# Patient Record
Sex: Female | Born: 1960 | Race: White | Hispanic: No | Smoking: Never smoker
Health system: Southern US, Community
[De-identification: ages and names within clinical notes are randomized; demographics above are authoritative.]

## PROBLEM LIST (undated history)

## (undated) DIAGNOSIS — C801 Malignant (primary) neoplasm, unspecified: Secondary | ICD-10-CM

## (undated) DIAGNOSIS — K219 Gastro-esophageal reflux disease without esophagitis: Secondary | ICD-10-CM

## (undated) DIAGNOSIS — I10 Essential (primary) hypertension: Secondary | ICD-10-CM

## (undated) HISTORY — PX: FRACTURE SURGERY: SHX138

## (undated) HISTORY — PX: CHOLECYSTECTOMY: SHX55

---

## 2004-05-27 ENCOUNTER — Ambulatory Visit: Payer: Self-pay

## 2005-09-02 ENCOUNTER — Ambulatory Visit: Payer: Self-pay

## 2006-09-28 ENCOUNTER — Ambulatory Visit: Payer: Self-pay

## 2007-10-19 ENCOUNTER — Ambulatory Visit: Payer: Self-pay

## 2007-12-28 ENCOUNTER — Ambulatory Visit: Payer: Self-pay

## 2008-04-30 ENCOUNTER — Encounter: Admission: RE | Admit: 2008-04-30 | Discharge: 2008-04-30 | Payer: Self-pay | Admitting: Neurosurgery

## 2008-10-03 ENCOUNTER — Ambulatory Visit: Payer: Self-pay

## 2009-02-22 ENCOUNTER — Emergency Department: Payer: Self-pay | Admitting: Emergency Medicine

## 2009-02-27 ENCOUNTER — Ambulatory Visit: Payer: Self-pay | Admitting: Specialist

## 2009-10-15 ENCOUNTER — Ambulatory Visit: Payer: Self-pay

## 2011-01-02 ENCOUNTER — Ambulatory Visit: Payer: Self-pay

## 2012-01-18 ENCOUNTER — Ambulatory Visit: Payer: Self-pay

## 2012-02-11 ENCOUNTER — Ambulatory Visit: Payer: Self-pay | Admitting: Internal Medicine

## 2013-01-18 ENCOUNTER — Ambulatory Visit: Payer: Self-pay

## 2013-04-26 ENCOUNTER — Ambulatory Visit: Payer: Self-pay | Admitting: Specialist

## 2013-12-04 DIAGNOSIS — E7800 Pure hypercholesterolemia, unspecified: Secondary | ICD-10-CM | POA: Insufficient documentation

## 2013-12-04 DIAGNOSIS — Z6835 Body mass index (BMI) 35.0-35.9, adult: Secondary | ICD-10-CM

## 2013-12-04 DIAGNOSIS — I10 Essential (primary) hypertension: Secondary | ICD-10-CM | POA: Insufficient documentation

## 2013-12-04 HISTORY — DX: Pure hypercholesterolemia, unspecified: E78.00

## 2014-02-01 ENCOUNTER — Ambulatory Visit: Payer: Self-pay

## 2015-01-11 ENCOUNTER — Other Ambulatory Visit: Payer: Self-pay | Admitting: Obstetrics and Gynecology

## 2015-01-11 DIAGNOSIS — Z1231 Encounter for screening mammogram for malignant neoplasm of breast: Secondary | ICD-10-CM

## 2015-02-04 ENCOUNTER — Ambulatory Visit: Payer: Self-pay

## 2015-02-05 ENCOUNTER — Ambulatory Visit
Admission: RE | Admit: 2015-02-05 | Discharge: 2015-02-05 | Disposition: A | Discharge status: Home / Self Care | Payer: BLUE CROSS/BLUE SHIELD | Source: Ambulatory Visit | Attending: Obstetrics and Gynecology | Admitting: Obstetrics and Gynecology

## 2015-02-05 DIAGNOSIS — Z1231 Encounter for screening mammogram for malignant neoplasm of breast: Secondary | ICD-10-CM | POA: Diagnosis not present

## 2015-04-24 DIAGNOSIS — Z0000 Encounter for general adult medical examination without abnormal findings: Secondary | ICD-10-CM | POA: Insufficient documentation

## 2016-01-23 ENCOUNTER — Other Ambulatory Visit: Payer: Self-pay | Admitting: Obstetrics and Gynecology

## 2016-01-23 DIAGNOSIS — Z1231 Encounter for screening mammogram for malignant neoplasm of breast: Secondary | ICD-10-CM

## 2016-02-07 ENCOUNTER — Ambulatory Visit: Payer: BLUE CROSS/BLUE SHIELD

## 2016-03-04 ENCOUNTER — Other Ambulatory Visit: Payer: Self-pay | Admitting: Obstetrics and Gynecology

## 2016-03-04 ENCOUNTER — Ambulatory Visit
Admission: RE | Admit: 2016-03-04 | Discharge: 2016-03-04 | Disposition: A | Discharge status: Home / Self Care | Payer: BLUE CROSS/BLUE SHIELD | Source: Ambulatory Visit | Attending: Obstetrics and Gynecology | Admitting: Obstetrics and Gynecology

## 2016-03-04 DIAGNOSIS — Z1231 Encounter for screening mammogram for malignant neoplasm of breast: Secondary | ICD-10-CM | POA: Insufficient documentation

## 2016-03-04 HISTORY — DX: Malignant (primary) neoplasm, unspecified: C80.1

## 2017-02-03 ENCOUNTER — Other Ambulatory Visit: Payer: Self-pay | Admitting: Obstetrics and Gynecology

## 2017-02-03 DIAGNOSIS — Z1239 Encounter for other screening for malignant neoplasm of breast: Secondary | ICD-10-CM

## 2017-03-09 ENCOUNTER — Ambulatory Visit
Admission: RE | Admit: 2017-03-09 | Discharge: 2017-03-09 | Disposition: A | Discharge status: Home / Self Care | Payer: BLUE CROSS/BLUE SHIELD | Source: Ambulatory Visit | Attending: Obstetrics and Gynecology | Admitting: Obstetrics and Gynecology

## 2017-03-09 ENCOUNTER — Encounter (HOSPITAL_COMMUNITY): Payer: Self-pay

## 2017-03-09 DIAGNOSIS — Z1231 Encounter for screening mammogram for malignant neoplasm of breast: Secondary | ICD-10-CM | POA: Diagnosis not present

## 2017-03-09 DIAGNOSIS — Z1239 Encounter for other screening for malignant neoplasm of breast: Secondary | ICD-10-CM

## 2017-04-07 ENCOUNTER — Emergency Department
Admission: EM | Admit: 2017-04-07 | Discharge: 2017-04-07 | Disposition: A | Discharge status: Home / Self Care | Payer: BLUE CROSS/BLUE SHIELD | Attending: Emergency Medicine | Admitting: Emergency Medicine

## 2017-04-07 ENCOUNTER — Emergency Department: Payer: BLUE CROSS/BLUE SHIELD

## 2017-04-07 DIAGNOSIS — I10 Essential (primary) hypertension: Secondary | ICD-10-CM | POA: Insufficient documentation

## 2017-04-07 DIAGNOSIS — R140 Abdominal distension (gaseous): Secondary | ICD-10-CM | POA: Insufficient documentation

## 2017-04-07 DIAGNOSIS — R0602 Shortness of breath: Secondary | ICD-10-CM | POA: Diagnosis not present

## 2017-04-07 HISTORY — DX: Essential (primary) hypertension: I10

## 2017-04-07 HISTORY — DX: Gastro-esophageal reflux disease without esophagitis: K21.9

## 2017-04-07 LAB — URINALYSIS, COMPLETE (UACMP) WITH MICROSCOPIC
BILIRUBIN URINE: NEGATIVE
Glucose, UA: NEGATIVE mg/dL
HGB URINE DIPSTICK: NEGATIVE
Ketones, ur: NEGATIVE mg/dL
NITRITE: NEGATIVE
PH: 5 (ref 5.0–8.0)
Protein, ur: NEGATIVE mg/dL
SPECIFIC GRAVITY, URINE: 1.019 (ref 1.005–1.030)

## 2017-04-07 LAB — COMPREHENSIVE METABOLIC PANEL
ALT: 24 U/L (ref 14–54)
AST: 24 U/L (ref 15–41)
Albumin: 4.4 g/dL (ref 3.5–5.0)
Alkaline Phosphatase: 59 U/L (ref 38–126)
Anion gap: 11 (ref 5–15)
BILIRUBIN TOTAL: 1.2 mg/dL (ref 0.3–1.2)
BUN: 11 mg/dL (ref 6–20)
CALCIUM: 9.1 mg/dL (ref 8.9–10.3)
CO2: 26 mmol/L (ref 22–32)
CREATININE: 0.62 mg/dL (ref 0.44–1.00)
Chloride: 102 mmol/L (ref 101–111)
GFR calc Af Amer: >60 mL/min (ref >60)
Glucose, Bld: 104 mg/dL — ABNORMAL HIGH (ref 65–99)
Potassium: 4 mmol/L (ref 3.5–5.1)
Sodium: 139 mmol/L (ref 135–145)
TOTAL PROTEIN: 7.9 g/dL (ref 6.5–8.1)

## 2017-04-07 LAB — CBC
HCT: 42.1 % (ref 35.0–47.0)
Hemoglobin: 14.1 g/dL (ref 12.0–16.0)
MCH: 29.7 pg (ref 26.0–34.0)
MCHC: 33.5 g/dL (ref 32.0–36.0)
MCV: 88.7 fL (ref 80.0–100.0)
PLATELETS: 306 10*3/uL (ref 150–440)
RBC: 4.74 MIL/uL (ref 3.80–5.20)
RDW: 13.7 % (ref 11.5–14.5)
WBC: 8.1 10*3/uL (ref 3.6–11.0)

## 2017-04-07 LAB — LIPASE, BLOOD: Lipase: 23 U/L (ref 11–51)

## 2017-04-07 MED ORDER — CEPHALEXIN 500 MG PO CAPS: 500.0000 mg | ORAL_CAPSULE | Freq: Two times a day (BID) | ORAL | 0 refills | Status: DC

## 2017-04-07 NOTE — ED Provider Notes (Signed)
[facility] Emergency Department Provider Note   ____________________________________________    I have reviewed the triage vital signs and the nursing notes.   HISTORY  Chief Complaint Abdominal bloating and shortness of breath    HPI Teresa Moreno is a 56 y.o. female who presents with complaints of abdominal bloating. Patient reports over the last couple of days she has felt significantly bloated and "gassy". She reports this happens to her occasionally. She has taken a stool softener without improvement. Today she became concerned because she felt mildly short of breath this morning. This seems to have resolved. She denies chest pain. No palpitations. She does report a history of anxiety and she wonders if this may be the cause. Currently she does feel well. No fevers or chills or cough. No recent travel. No calf pain or pleurisy.   Past Medical History:  Diagnosis Date  . basal cell   . GERD (gastroesophageal reflux disease)   . Hypertension     There are no active problems to display for this patient.   Past Surgical History:  Procedure Laterality Date  . CHOLECYSTECTOMY    . FRACTURE SURGERY      Prior to Admission medications   Medication Sig Start Date End Date Taking? Authorizing Provider  cephALEXin (KEFLEX) 500 MG capsule Take 1 capsule (500 mg total) by mouth 2 (two) times daily. 04/07/17   Lavonia Drafts, MD     Allergies Codeine  Family History  Problem Relation Age of Onset  . Breast cancer Maternal Aunt 72    Social History Social History  Substance Use Topics  . Smoking status: Never Smoker  . Smokeless tobacco: Never Used  . Alcohol use Yes    Review of Systems  Constitutional: No fever/chills Eyes: No visual changes.  ENT: No sore throat. Cardiovascular: Denies chest pain. Respiratory: As above Gastrointestinal: As above Genitourinary: Negative for dysuria. Musculoskeletal: Negative for back pain. Skin:  Negative for rash. Neurological: Negative for headaches    ____________________________________________   PHYSICAL EXAM:  VITAL SIGNS: ED Triage Vitals  Enc Vitals Group     BP 04/07/17 1158 135/84     Pulse Rate 04/07/17 1158 72     Resp 04/07/17 1158 16     Temp 04/07/17 1158 98.1 F (36.7 C)     Temp Source 04/07/17 1158 Oral     SpO2 04/07/17 1158 98 %     Weight 04/07/17 1159 112.5 kg (248 lb)     Height 04/07/17 1159 1.676 m (5\' 6" )     Head Circumference --      Peak Flow --      Pain Score 04/07/17 1348 0     Pain Loc --      Pain Edu? --      Excl. in [facility]? --     Constitutional: Alert and oriented. No acute distress. Pleasant and interactive  Nose: No congestion/rhinnorhea. Mouth/Throat: Mucous membranes are moist.    Cardiovascular: Normal rate, regular rhythm. Grossly normal heart sounds.  Good peripheral circulation. Respiratory: Normal respiratory effort.  No retractions. Lungs CTAB. Gastrointestinal: Soft and nontender. No distention.  No CVA tenderness. Genitourinary: deferred Musculoskeletal: No lower extremity tenderness nor edema.  Warm and well perfused Neurologic:  Normal speech and language. No gross focal neurologic deficits are appreciated.  Skin:  Skin is warm, dry and intact. No rash noted. Psychiatric: Mood and affect are normal. Speech and behavior are normal.  ____________________________________________   LABS (all  labs ordered are listed, but only abnormal results are displayed)  Labs Reviewed  COMPREHENSIVE METABOLIC PANEL - Abnormal; Notable for the following:       Result Value   Glucose, Bld 104 (*)    All other components within normal limits  URINALYSIS, COMPLETE (UACMP) WITH MICROSCOPIC - Abnormal; Notable for the following:    Color, Urine YELLOW (*)    APPearance CLOUDY (*)    Leukocytes, UA LARGE (*)    Bacteria, UA RARE (*)    Squamous Epithelial / LPF 6-30 (*)    All other components within normal limits  LIPASE,  BLOOD  CBC   ____________________________________________  EKG  ED ECG REPORT I, Lavonia Drafts, the attending physician, personally viewed and interpreted this ECG.  Date: 04/07/2017  Rhythm: normal sinus rhythm QRS Axis: normal Intervals: normal ST/T Wave abnormalities: normal Narrative Interpretation: no evidence of acute ischemia  ____________________________________________  RADIOLOGY  Chest x-ray unremarkable, KUB unremarkable ____________________________________________   PROCEDURES  Procedure(s) performed: No    Critical Care performed: No ____________________________________________   INITIAL IMPRESSION / ASSESSMENT AND PLAN / ED COURSE  Pertinent labs & imaging results that were available during my care of the patient were reviewed by me and considered in my medical decision making (see chart for details).  Patient well-appearing in no acute distress.  Differential diagnosis includes bronchospasm, CHF, bronchitis, pneumonia, abdominal distention causing shortness of breath  Normal respiratory exam, no wheezing. Lab work is unremarkable. Abdominal exam is soft with some mild distention. KUB and chest x-ray unremarkable. EKG is normal.  No emergent findings, recommended outpatient follow-up with her PCP    ____________________________________________   FINAL CLINICAL IMPRESSION(S) / ED DIAGNOSES  Final diagnoses:  Shortness of breath  Abdominal bloating      NEW MEDICATIONS STARTED DURING THIS VISIT:  There are no discharge medications for this patient.    Note:  This document was prepared using Dragon voice recognition software and may include unintentional dictation errors.    Lavonia Drafts, MD 04/07/17 [phone]

## 2017-04-07 NOTE — ED Notes (Signed)
FIRST NURSE NOTE:  Pt c/o abdominal pain and shortness of breath, states she has been belching more as well.

## 2017-04-07 NOTE — ED Triage Notes (Signed)
Pt c/o having gasy, bubbly stomach with nausea since Sunday with acid reflux.

## 2017-04-12 ENCOUNTER — Other Ambulatory Visit
Admission: RE | Admit: 2017-04-12 | Discharge: 2017-04-12 | Disposition: A | Discharge status: Home / Self Care | Payer: BLUE CROSS/BLUE SHIELD | Source: Ambulatory Visit | Attending: Internal Medicine | Admitting: Internal Medicine

## 2017-04-12 DIAGNOSIS — R1013 Epigastric pain: Secondary | ICD-10-CM | POA: Diagnosis not present

## 2017-04-12 DIAGNOSIS — R0602 Shortness of breath: Secondary | ICD-10-CM | POA: Diagnosis present

## 2017-04-12 LAB — TROPONIN I: Troponin I: <0.03 ng/mL (ref <0.03)

## 2017-04-12 LAB — FIBRIN DERIVATIVES D-DIMER (ARMC ONLY): FIBRIN DERIVATIVES D-DIMER (ARMC): 347.04 ng{FEU}/mL (ref 0.00–499.00)

## 2017-05-03 ENCOUNTER — Ambulatory Visit: Payer: BLUE CROSS/BLUE SHIELD | Admitting: Gastroenterology

## 2017-05-03 ENCOUNTER — Encounter: Payer: Self-pay | Admitting: Gastroenterology

## 2017-05-03 ENCOUNTER — Other Ambulatory Visit: Payer: Self-pay

## 2017-05-03 VITALS — BP 134/77 | HR 87 | Temp 98.2°F | Ht 66.5 in | Wt 253.0 lb

## 2017-05-03 DIAGNOSIS — R140 Abdominal distension (gaseous): Secondary | ICD-10-CM | POA: Diagnosis not present

## 2017-05-03 DIAGNOSIS — K219 Gastro-esophageal reflux disease without esophagitis: Secondary | ICD-10-CM

## 2017-05-03 NOTE — Progress Notes (Signed)
Gastroenterology Consultation  Referring Provider:     Kirk Ruths, MD Primary Care Physician:  Kirk Ruths, MD Primary Gastroenterologist:  Dr. Allen Norris     Reason for Consultation:     Bloating and reflux        HPI:   Teresa Moreno is a 56 y.o. y/o female referred for consultation & management of Bloating and reflux by Dr. Ouida Sills, Ocie Cornfield, MD.  This patient comes to see me today after previously following up with Dr. Vira Agar.  The patient had a colonoscopy last year by Dr. Vira Agar for a history of colon polyps.  The patient now reports that she has had some intermittent dysphagia with food getting stuck proximally once a month.  She also reports that she had a lot of bloating with burping and had spoken to her ENT doctor who put her on omeprazole 40 mg once a day.  The patient had already been taking Protonix 40 mg a day.  The patient states that she takes one in the morning and the other one at night.  There is no report of any recent increase in weight.  She also denies any unexplained weight loss.  There is no report of any black stools or bloody stools.  She also denies any worsening of her dysphagia.  The patient states that her symptoms were so bad that she thought she was having a heart attack and went to the emergency room.  She had a workup in the emergency room which came to the conclusion that she was not having problems with her heart.  Since starting the omeprazole 40 mg a day she states her symptoms have been greatly improved.  Past Medical History:  Diagnosis Date  . basal cell   . GERD (gastroesophageal reflux disease)   . Hypertension     Past Surgical History:  Procedure Laterality Date  . CHOLECYSTECTOMY    . FRACTURE SURGERY      Prior to Admission medications   Medication Sig Start Date End Date Taking? Authorizing Provider  amLODipine (NORVASC) 5 MG tablet  02/23/17  Yes [provider]  aspirin EC 81 MG tablet Take 81 mg by mouth.    Yes [provider]  omeprazole (PRILOSEC) 40 MG capsule Take 40 mg daily by mouth.   Yes [provider]  pantoprazole (PROTONIX) 40 MG tablet  04/12/17  Yes [provider]  celecoxib (CELEBREX) 200 MG capsule Take by mouth.    [provider]  cephALEXin (KEFLEX) 500 MG capsule Take 1 capsule (500 mg total) by mouth 2 (two) times daily. Patient not taking: Reported on 05/03/2017 04/07/17   Lavonia Drafts, MD  Flaxseed, Linseed, (FLAX SEED OIL) 1000 MG CAPS Take by mouth.    [provider]  mometasone (ELOCON) 0.1 % cream APPLY SPARINGLY AND RUB IN WELL TO AFFECTED AREAS TWICE A DAY 02/05/14   [provider]  Multiple Vitamin (MULTI-VITAMINS) TABS Take by mouth.    [provider]  Multiple Vitamins-Minerals (MULTIVITAMIN ADULT PO) Take by mouth.    [provider]  norethindrone (MICRONOR,CAMILA,ERRIN) 0.35 MG tablet  03/31/17   [provider]  Omega-3 1000 MG CAPS Take by mouth.    [provider]    Family History  Problem Relation Age of Onset  . Breast cancer Maternal Aunt 72     Social History   Tobacco Use  . Smoking status: Never Smoker  . Smokeless tobacco: Never Used  Substance Use Topics  . Alcohol use: Yes  . Drug use: No    Allergies as of 05/03/2017 - Review Complete 05/03/2017  Allergen Reaction Noted  . Codeine Rash 04/07/2017    Review of Systems:    All systems reviewed and negative except where noted in HPI.   Physical Exam:  BP 134/77 (BP Location: Left Arm, Patient Position: Sitting, Cuff Size: Large)   Pulse 87   Temp 98.2 F (36.8 C) (Oral)   Ht 5' 6.5" (1.689 m)   Wt 253 lb (114.8 kg)   LMP 01/14/2015   BMI 40.22 kg/m  Patient's last menstrual period was 01/14/2015. Psych:  Alert and cooperative. Normal mood and affect. General:   Alert,  Well-developed, well-nourished, pleasant and cooperative in NAD Head:  Normocephalic and atraumatic. Eyes:  Sclera  clear, no icterus.   Conjunctiva pink. Ears:  Normal auditory acuity. Nose:  No deformity, discharge, or lesions. Mouth:  No deformity or lesions,oropharynx pink & moist. Neck:  Supple; no masses or thyromegaly. Lungs:  Respirations even and unlabored.  Clear throughout to auscultation.   No wheezes, crackles, or rhonchi. No acute distress. Heart:  Regular rate and rhythm; no murmurs, clicks, rubs, or gallops. Abdomen:  Normal bowel sounds.  No bruits.  Soft, non-tender and non-distended without masses, hepatosplenomegaly or hernias noted.  No guarding or rebound tenderness.  Negative Carnett sign.   Rectal:  Deferred.  Msk:  Symmetrical without gross deformities.  Good, equal movement & strength bilaterally. Pulses:  Normal pulses noted. Extremities:  No clubbing or edema.  No cyanosis. Neurologic:  Alert and oriented x3;  grossly normal neurologically. Skin:  Intact without significant lesions or rashes.  No jaundice. Lymph Nodes:  No significant cervical adenopathy. Psych:  Alert and cooperative. Normal mood and affect.  Imaging Studies: Dg Chest 2 View  Result Date: 04/07/2017 CLINICAL DATA:  Abdominal distention with nausea for 3 days. Shortness of breath. History of hypertension. EXAM: CHEST  2 VIEW COMPARISON:  None. FINDINGS: Suboptimal inspiration. The heart size and mediastinal contours are normal. Ill-defined density adjacent to the left heart border on the frontal examination is attributed to epicardial fat, without corresponding finding on the lateral view. The lungs appear clear. There is no pleural effusion or pneumothorax. No acute osseous findings are evident. Cholecystectomy clips are present. IMPRESSION: No active cardiopulmonary process. Electronically Signed   By: Richardean Sale M.D.   On: 04/07/2017 13:26   Dg Abdomen 1 View  Result Date: 04/07/2017 CLINICAL DATA:  56 year old female with a history of bloating EXAM: ABDOMEN - 1 VIEW COMPARISON:  None. FINDINGS:  Limited images of the abdomen with exclusion of portions of the lateral abdomen and mid abdomen. Gas within stomach, small bowel, colon.  No abnormal distention. No unexpected soft tissue density. No unexpected radiopaque foreign body. No unexpected calcification. Pelvic phleboliths. No displaced fracture. Surgical changes of cholecystectomy. IMPRESSION: Nonobstructive bowel gas pattern Electronically Signed   By: Corrie Mckusick D.O.   On: 04/07/2017 13:26    Assessment and Plan:   NIKOLE SWARTZENTRUBER is a 56 y.o. y/o female with a history of reflux symptoms that included bloating and burping.  The patient has had less of this since taking a PPI twice a day. She is taking Protonix 40 mg once a day and omeprazole 40 mg once a day.  The patient has been told to stop this and to start Dexilant 60 mg once a day.  She has also been recommended to undergo  an upper endoscopy.  The patient has an issue with her insurance that she cannot have it done except at a [facility]. I will try to contact somebody who can do her procedure at an [facility] to investigate her esophagus for a hiatal hernia versus a stricture versus esophagitis. The patient has also been told to try and avoid anything that causes her to swallow air which is resulting in her having burping and her bloating.  The patient has been explained the plan and agrees with it.  Lucilla Lame, MD. Marval Regal   Note: This dictation was prepared with Dragon dictation along with smaller phrase technology. Any transcriptional errors that result from this process are unintentional.

## 2018-02-07 ENCOUNTER — Ambulatory Visit (INDEPENDENT_AMBULATORY_CARE_PROVIDER_SITE_OTHER): Payer: BLUE CROSS/BLUE SHIELD | Admitting: Sports Medicine

## 2018-02-07 VITALS — BP 128/80 | Ht 66.0 in | Wt 230.0 lb

## 2018-02-07 DIAGNOSIS — M7661 Achilles tendinitis, right leg: Secondary | ICD-10-CM

## 2018-02-07 HISTORY — DX: Achilles tendinitis, right leg: M76.61

## 2018-02-07 NOTE — Progress Notes (Addendum)
  Teresa Moreno - 57 y.o. female MRN [id number]  Date of birth: 11-17-1960  SUBJECTIVE:  Including CC & ROS.  Right Heel Pain   HISTORY: Past Medical, Surgical, Social, and Family History Reviewed & Updated per EMR.   Pertinent Historical Findings include: Patient is a 57 y/o female with no pertinent past medical history that presents today for right heel pain. She states she first experienced pain in her right heel 3 years ago that was due to excessive walking and standing and going up steps. She took Alleve for 3-4 days and the pain improved and went away. She has had random episodes of right heel pain since with each episode being relieved with Alleve. However, beginning on the first of June she began having more intense heel pain located at the top of her right heel that has not improved with 5-7 days of Alleve. The pain is better when wearing raised shoes or high heel shoes and worse when walking barefoot. She also states for the past week she has been using a product called [place] gel twice a day which has helped the pain. She had an x-ray by her "foot doctor" that showed no fracture, just a heel spur.  DATA REVIEWED: No pertinent labs or imaging available at today's visit  PHYSICAL EXAM:  VS: BP:128/80  HR: bpm  TEMP: ( )  RESP:   HT:5\' 6"  (167.6 cm)   WT:230 lb (104.3 kg)  BMI:37.14 PHYSICAL EXAM: Gen: Alert and Oriented x 3, NAD HEENT: Normocephalic, atraumatic, PERRLA, EOMI MSK: Full ROM in ankle both passive and active. Mild swelling over the proximal tip of the calcaneous at the insertion of the achilles tendon Ext: no clubbing, cyanosis, or edema Neuro: BLE strength 5/5 with gross sensation intact, no deficits Skin: warm, dry, intact, no rashes  IMAGING Ultrasound performed in clinic today showed fluid at and around the insertion site of the achilles tendon to the calcaneous. Calcifications and hyperechoic changes also noted to the achilles tendon proximal to the insertion  site.  ASSESSMENT & PLAN: See problem based charting & AVS for pt instructions.  Achilles tendinitis of right lower extremity Ultrasound performed in office today consistent with history and physical exam suggesting Achilles tendonitis of a chronic nature. Full range of motion, pain improved with OTC gel containing menthol, and ability to walk without a limp makes ankle fracture or sprain less likely.  At home exercises and stretches for one month in addition to wearing raised or platform shoes and follow up in our office.    Harolyn Rutherford, DO [facility], PGY-2    Patient seen and evaluated with the resident. I agree with the above plan of care. Patient has evidence of insertional Achilles tendinopathy. Treatment as above. Follow-up as needed.

## 2018-02-07 NOTE — Assessment & Plan Note (Signed)
Ultrasound performed in office today consistent with history and physical exam suggesting Achilles tendonitis of a chronic nature. Full range of motion, pain improved with OTC gel containing menthol, and ability to walk without a limp makes ankle fracture or sprain less likely.  At home exercises and stretches for one month in addition to wearing raised or platform shoes and follow up in our office.

## 2018-02-07 NOTE — Addendum Note (Signed)
Addended by: Lilia Argue R on: 02/07/2018 01:25 PM   Modules accepted: Level of Service

## 2018-03-15 ENCOUNTER — Ambulatory Visit: Payer: BLUE CROSS/BLUE SHIELD | Admitting: Sports Medicine

## 2018-03-18 ENCOUNTER — Other Ambulatory Visit: Payer: Self-pay | Admitting: Obstetrics and Gynecology

## 2018-03-18 DIAGNOSIS — Z1231 Encounter for screening mammogram for malignant neoplasm of breast: Secondary | ICD-10-CM

## 2018-04-07 ENCOUNTER — Ambulatory Visit
Admission: RE | Admit: 2018-04-07 | Discharge: 2018-04-07 | Disposition: A | Discharge status: Home / Self Care | Payer: BLUE CROSS/BLUE SHIELD | Source: Ambulatory Visit | Attending: Obstetrics and Gynecology | Admitting: Obstetrics and Gynecology

## 2018-04-07 DIAGNOSIS — Z1231 Encounter for screening mammogram for malignant neoplasm of breast: Secondary | ICD-10-CM | POA: Insufficient documentation

## 2019-03-23 ENCOUNTER — Other Ambulatory Visit: Payer: Self-pay | Admitting: Certified Nurse Midwife

## 2019-03-23 DIAGNOSIS — N6310 Unspecified lump in the right breast, unspecified quadrant: Secondary | ICD-10-CM

## 2019-03-31 ENCOUNTER — Ambulatory Visit
Admission: RE | Admit: 2019-03-31 | Discharge: 2019-03-31 | Disposition: A | Discharge status: Home / Self Care | Payer: PRIVATE HEALTH INSURANCE | Source: Ambulatory Visit | Attending: Certified Nurse Midwife | Admitting: Certified Nurse Midwife

## 2019-03-31 DIAGNOSIS — N6310 Unspecified lump in the right breast, unspecified quadrant: Secondary | ICD-10-CM | POA: Diagnosis not present

## 2020-01-06 ENCOUNTER — Emergency Department: Payer: BC Managed Care – PPO

## 2020-01-06 ENCOUNTER — Encounter: Payer: Self-pay | Admitting: Intensive Care

## 2020-01-06 ENCOUNTER — Other Ambulatory Visit: Payer: Self-pay

## 2020-01-06 DIAGNOSIS — Z7982 Long term (current) use of aspirin: Secondary | ICD-10-CM | POA: Insufficient documentation

## 2020-01-06 DIAGNOSIS — I10 Essential (primary) hypertension: Secondary | ICD-10-CM | POA: Diagnosis not present

## 2020-01-06 DIAGNOSIS — R200 Anesthesia of skin: Secondary | ICD-10-CM | POA: Diagnosis not present

## 2020-01-06 LAB — CBC
HCT: 42.5 % (ref 36.0–46.0)
Hemoglobin: 13.9 g/dL (ref 12.0–15.0)
MCH: 28.8 pg (ref 26.0–34.0)
MCHC: 32.7 g/dL (ref 30.0–36.0)
MCV: 88 fL (ref 80.0–100.0)
Platelets: 336 10*3/uL (ref 150–400)
RBC: 4.83 MIL/uL (ref 3.87–5.11)
RDW: 13.2 % (ref 11.5–15.5)
WBC: 8.3 10*3/uL (ref 4.0–10.5)
nRBC: 0 % (ref 0.0–0.2)

## 2020-01-06 LAB — BASIC METABOLIC PANEL
Anion gap: 12 (ref 5–15)
BUN: 15 mg/dL (ref 6–20)
CO2: 23 mmol/L (ref 22–32)
Calcium: 9.2 mg/dL (ref 8.9–10.3)
Chloride: 103 mmol/L (ref 98–111)
Creatinine, Ser: 0.65 mg/dL (ref 0.44–1.00)
GFR calc Af Amer: >60 mL/min (ref >60)
GFR calc non Af Amer: >60 mL/min (ref >60)
Glucose, Bld: 91 mg/dL (ref 70–99)
Potassium: 4 mmol/L (ref 3.5–5.1)
Sodium: 138 mmol/L (ref 135–145)

## 2020-01-06 LAB — TROPONIN I (HIGH SENSITIVITY)
Troponin I (High Sensitivity): 3 ng/L (ref <18)
Troponin I (High Sensitivity): <2 ng/L (ref <18)

## 2020-01-06 NOTE — ED Triage Notes (Addendum)
Patient c/o numbness on left side of face the past few days that has now spread throughout her whole face. C/o palpitations intermittently for months. Also reports feeling lightheaded today and abdominal swelling. Denies LOC. Ambulatory into triage with no problems. Face symmetrical. No weakness. Speech clear

## 2020-01-07 ENCOUNTER — Emergency Department
Admission: EM | Admit: 2020-01-07 | Discharge: 2020-01-07 | Disposition: A | Discharge status: Home / Self Care | Payer: BC Managed Care – PPO | Attending: Emergency Medicine | Admitting: Emergency Medicine

## 2020-01-07 ENCOUNTER — Emergency Department: Payer: BC Managed Care – PPO

## 2020-01-07 DIAGNOSIS — R200 Anesthesia of skin: Secondary | ICD-10-CM

## 2020-01-07 NOTE — ED Notes (Signed)
Pt states for last several days she has had intermittent jaw numbness, today it made her dizzy, she is believing that dizziness is due to anxiety

## 2020-01-07 NOTE — Discharge Instructions (Addendum)
Return to the emergency room if you have facial droop, slurred speech, changes in vision, unilateral weakness or numbness, dizziness, headaches, chest pain.  Otherwise follow-up with your doctor in 2 to 3 days for further evaluation.

## 2020-01-07 NOTE — ED Provider Notes (Signed)
[facility] Emergency Department Provider Note  ____________________________________________  Time seen: Approximately 1:44 AM  I have reviewed the triage vital signs and the nursing notes.   HISTORY  Chief Complaint Numbness   HPI Teresa Moreno is a 59 y.o. female the history of hypertension and hyperlipidemia presents for evaluation of facial numbness.  Patient reports intermittent episodes of lower bilateral facial numbness for the last 2 weeks.  She reports that sometimes the numbness is unilateral and sometimes bilateral.  Today the numbness was bilateral located around the jaw area and moving up to bilateral cheeks.  She denies changes in vision, slurred speech, facial droop, unilateral weakness or numbness of her extremities, dizziness, headache.  No personal or family history of MS or demyelinating disorders.  No new medications.   Past Medical History:  Diagnosis Date  . basal cell   . GERD (gastroesophageal reflux disease)   . Hypertension     Patient Active Problem List   Diagnosis Date Noted  . Achilles tendinitis of right lower extremity 02/07/2018  . Health care maintenance 04/24/2015  . Essential hypertension 12/04/2013  . Pure hypercholesterolemia 12/04/2013  . Severe obesity (BMI 35.0-35.9 with comorbidity) ([place]) 12/04/2013    Past Surgical History:  Procedure Laterality Date  . CHOLECYSTECTOMY    . FRACTURE SURGERY      Prior to Admission medications   Medication Sig Start Date End Date Taking? Authorizing Provider  amLODipine (NORVASC) 5 MG tablet  02/23/17   [provider]  aspirin EC 81 MG tablet Take 81 mg by mouth.    [provider]  celecoxib (CELEBREX) 200 MG capsule Take by mouth.    [provider]  cephALEXin (KEFLEX) 500 MG capsule Take 1 capsule (500 mg total) by mouth 2 (two) times daily. Patient not taking: Reported on 05/03/2017 04/07/17   Lavonia Drafts, MD  Flaxseed, Linseed, (FLAX  SEED OIL) 1000 MG CAPS Take by mouth.    [provider]  mometasone (ELOCON) 0.1 % cream APPLY SPARINGLY AND RUB IN WELL TO AFFECTED AREAS TWICE A DAY 02/05/14   [provider]  Multiple Vitamin (MULTI-VITAMINS) TABS Take by mouth.    [provider]  Multiple Vitamins-Minerals (MULTIVITAMIN ADULT PO) Take by mouth.    [provider]  norethindrone (MICRONOR,CAMILA,ERRIN) 0.35 MG tablet  03/31/17   [provider]  Omega-3 1000 MG CAPS Take by mouth.    [provider]  omeprazole (PRILOSEC) 40 MG capsule Take 40 mg daily by mouth.    [provider]  pantoprazole (PROTONIX) 40 MG tablet  04/12/17   [provider]    Allergies Codeine  Family History  Problem Relation Age of Onset  . Breast cancer Maternal Aunt 72    Social History Social History   Tobacco Use  . Smoking status: Never Smoker  . Smokeless tobacco: Never Used  Vaping Use  . Vaping Use: Never used  Substance Use Topics  . Alcohol use: Yes    Alcohol/week: 6.0 standard drinks    Types: 4 Shots of liquor, 2 Glasses of wine per week  . Drug use: No    Review of Systems  Constitutional: Negative for fever. Eyes: Negative for visual changes. ENT: Negative for sore throat. Neck: No neck pain  Cardiovascular: Negative for chest pain. Respiratory: Negative for shortness of breath. Gastrointestinal: Negative for abdominal pain, vomiting or diarrhea. Genitourinary: Negative for dysuria. Musculoskeletal: Negative for back pain. Skin: Negative for rash. Neurological: Negative  for headaches, weakness. + bilateral facial numbness Psych: No SI or HI  ____________________________________________   PHYSICAL EXAM:  VITAL SIGNS: ED Triage Vitals  Enc Vitals Group     BP 01/06/20 1648 (!) 145/93     Pulse Rate 01/06/20 1648 85     Resp 01/06/20 1648 18     Temp 01/06/20 1648 97.9 F (36.6 C)     Temp Source 01/06/20 1648 Oral     SpO2  01/06/20 1648 96 %     Weight 01/06/20 1649 240 lb (108.9 kg)     Height 01/06/20 1649 5\' 6"  (1.676 m)     Head Circumference --      Peak Flow --      Pain Score 01/06/20 1649 0     Pain Loc --      Pain Edu? --      Excl. in [place]? --     Constitutional: Alert and oriented. Well appearing and in no apparent distress. HEENT:      Head: Normocephalic and atraumatic.         Eyes: Conjunctivae are normal. Sclera is non-icteric.       Mouth/Throat: Mucous membranes are moist.       Neck: Supple with no signs of meningismus. Cardiovascular: Regular rate and rhythm. No murmurs, gallops, or rubs.  No bruits Respiratory: Normal respiratory effort. Lungs are clear to auscultation bilaterally. Gastrointestinal: Soft, non tender. Musculoskeletal:  No edema, cyanosis, or erythema of extremities. Neurologic: Normal speech and language. EOMI, PERRL, face symmetric, no pronator drift, no dysmetria, intact strength and sensation x4.  Patient has normal sensation to touch on bilateral face. Skin: Skin is warm, dry and intact. No rash noted. Psychiatric: Mood and affect are normal. Speech and behavior are normal.  ____________________________________________   LABS (all labs ordered are listed, but only abnormal results are displayed)  Labs Reviewed  BASIC METABOLIC PANEL  CBC  TROPONIN I (HIGH SENSITIVITY)  TROPONIN I (HIGH SENSITIVITY)   ____________________________________________  EKG  ED ECG REPORT I, Rudene Re, the attending physician, personally viewed and interpreted this ECG.  Normal sinus rhythm, rate of 86, normal intervals, normal axis, no ST elevations or depressions. ____________________________________________  RADIOLOGY  I have personally reviewed the images performed during this visit and I agree with the Radiologist's read.   Interpretation by Radiologist:  DG Chest 2 View  Result Date: 01/06/2020 CLINICAL DATA:  Intermittent palpitations for several days,  lightheadedness EXAM: CHEST - 2 VIEW COMPARISON:  04/07/2017 FINDINGS: The heart size and mediastinal contours are within normal limits. Both lungs are clear. The visualized skeletal structures are unremarkable. IMPRESSION: No active cardiopulmonary disease. Electronically Signed   By: Randa Ngo M.D.   On: 01/06/2020 17:35   CT Head Wo Contrast  Result Date: 01/07/2020 CLINICAL DATA:  Intermittent facial numbness and dizziness EXAM: CT HEAD WITHOUT CONTRAST TECHNIQUE: Contiguous axial images were obtained from the base of the skull through the vertex without intravenous contrast. COMPARISON:  Brain MRI 04/23/2009 FINDINGS: Brain: There is no mass, hemorrhage or extra-axial collection. The size and configuration of the ventricles and extra-axial CSF spaces are normal. The brain parenchyma is normal, without acute or chronic infarction. 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 Vascular: No abnormal hyperdensity of the major intracranial arteries or dural venous sinuses. No intracranial atherosclerosis. Skull: The visualized skull base, calvarium and extracranial soft tissues are normal. Sinuses/Orbits: No fluid levels or advanced mucosal thickening of the  visualized paranasal sinuses. No mastoid or middle ear effusion. The orbits are normal. IMPRESSION: 1. No acute intracranial abnormality. 2. 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 Electronically Signed   By: Ulyses Jarred M.D.   On: 01/07/2020 02:04     ____________________________________________   PROCEDURES  Procedure(s) performed:yes .1-3 Lead EKG Interpretation Performed by: Rudene Re, MD Authorized by: Rudene Re, MD     Interpretation: normal     ECG rate assessment: normal     Rhythm: sinus rhythm     Ectopy: none     Critical Care performed:  None ____________________________________________   INITIAL IMPRESSION /  ASSESSMENT AND PLAN / ED COURSE  59 y.o. female the history of hypertension and hyperlipidemia presents for evaluation of intermittent bilateral lower facial numbness x 2 weeks.  Patient is well-appearing in no distress with normal vital signs, completely neurologically intact on exam.  EKG with no evidence of dysrhythmias.  Low suspicion for CVA or TIA since symptoms seem to be bilaterally.  Possibly demyelinating disorder therefore will check a CT head.  Also thought about electrolyte derangements including potassium and calcium but those were normal. Peripheral neuropathy also possible but patient is not a diabetic. If CT head is negative and patient remains neuro intact will refer to PCP for further evaluation. Old medical records reviewed.  Patient placed on telemetry for close monitoring.  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  At this time will discharge home with follow-up with PCP.  Discussed my standard return precautions.    _____________________________________________ Please note:  Patient was evaluated in Emergency Department today for the symptoms described in the history of present illness. Patient was evaluated in the context of the global COVID-19 pandemic, which necessitated consideration that the patient might be at risk for infection with the SARS-CoV-2 virus that causes COVID-19. Institutional protocols and algorithms that pertain to the evaluation of patients at risk for COVID-19 are in a state of rapid change based on information released by regulatory bodies including the CDC and federal and state organizations. These policies and algorithms were followed during the patient's care in the ED.  Some ED evaluations and interventions may be delayed as a result of limited staffing during the pandemic.    Controlled Substance  Database was reviewed by me. ____________________________________________   FINAL CLINICAL IMPRESSION(S) / ED DIAGNOSES   Final diagnoses:  Facial numbness      NEW MEDICATIONS STARTED DURING THIS VISIT:  ED Discharge Orders    None       Note:  This document was prepared using Dragon voice recognition software and may include unintentional dictation errors.    Alfred Levins, [place], MD 01/07/20 Natasha Mead

## 2020-01-16 ENCOUNTER — Other Ambulatory Visit: Payer: Self-pay | Admitting: Unknown Physician Specialty

## 2020-01-16 DIAGNOSIS — R42 Dizziness and giddiness: Secondary | ICD-10-CM

## 2020-01-22 ENCOUNTER — Other Ambulatory Visit: Payer: Self-pay | Admitting: Internal Medicine

## 2020-01-22 DIAGNOSIS — I208 Other forms of angina pectoris: Secondary | ICD-10-CM

## 2020-01-22 DIAGNOSIS — G459 Transient cerebral ischemic attack, unspecified: Secondary | ICD-10-CM

## 2020-01-31 ENCOUNTER — Other Ambulatory Visit: Payer: Self-pay

## 2020-01-31 ENCOUNTER — Ambulatory Visit
Admission: RE | Admit: 2020-01-31 | Discharge: 2020-01-31 | Disposition: A | Discharge status: Home / Self Care | Payer: BC Managed Care – PPO | Source: Ambulatory Visit | Attending: Unknown Physician Specialty | Admitting: Unknown Physician Specialty

## 2020-01-31 ENCOUNTER — Telehealth (HOSPITAL_COMMUNITY): Payer: Self-pay | Admitting: *Deleted

## 2020-01-31 ENCOUNTER — Other Ambulatory Visit (HOSPITAL_COMMUNITY): Payer: Self-pay | Admitting: *Deleted

## 2020-01-31 DIAGNOSIS — R42 Dizziness and giddiness: Secondary | ICD-10-CM | POA: Diagnosis present

## 2020-01-31 MED ORDER — METOPROLOL TARTRATE 100 MG PO TABS: ORAL_TABLET | ORAL | 1 refills | Status: DC

## 2020-01-31 MED ORDER — GADOBUTROL 1 MMOL/ML IV SOLN
10.0000 mL | Freq: Once | INTRAVENOUS | Status: AC | PRN
  Administered 2020-01-31: 10 mL via INTRAVENOUS

## 2020-01-31 NOTE — Telephone Encounter (Signed)
Reaching out to patient to offer assistance regarding upcoming cardiac imaging study; pt verbalizes understanding of appt date/time, parking situation and where to check in, pre-test NPO status and medications ordered, and verified current allergies; name and call back number provided for further questions should they arise ° °[facility] Tai RN Navigator Cardiac Imaging °[facility] °[phone] office °[phone] cell ° °

## 2020-02-01 ENCOUNTER — Ambulatory Visit
Admission: RE | Admit: 2020-02-01 | Discharge: 2020-02-01 | Disposition: A | Discharge status: Home / Self Care | Payer: BC Managed Care – PPO | Source: Ambulatory Visit | Attending: Internal Medicine | Admitting: Internal Medicine

## 2020-02-01 DIAGNOSIS — G459 Transient cerebral ischemic attack, unspecified: Secondary | ICD-10-CM | POA: Insufficient documentation

## 2020-02-01 DIAGNOSIS — I208 Other forms of angina pectoris: Secondary | ICD-10-CM

## 2020-02-01 MED ORDER — IOHEXOL 350 MG/ML SOLN
90.0000 mL | Freq: Once | INTRAVENOUS | Status: AC | PRN
  Administered 2020-02-01: 90 mL via INTRAVENOUS

## 2020-02-01 MED ORDER — METOPROLOL TARTRATE 5 MG/5ML IV SOLN
10.0000 mg | Freq: Once | INTRAVENOUS | Status: AC
  Administered 2020-02-01: 10 mg via INTRAVENOUS

## 2020-02-01 MED ORDER — NITROGLYCERIN 0.4 MG SL SUBL
0.8000 mg | SUBLINGUAL_TABLET | Freq: Once | SUBLINGUAL | Status: AC
  Administered 2020-02-01: 0.8 mg via SUBLINGUAL

## 2020-02-01 MED ORDER — NITROGLYCERIN 0.4 MG SL SUBL
0.4000 mg | SUBLINGUAL_TABLET | Freq: Once | SUBLINGUAL | Status: AC
  Administered 2020-02-01: 0.4 mg via SUBLINGUAL

## 2020-02-01 MED ORDER — ONDANSETRON HCL 4 MG/2ML IJ SOLN
4.0000 mg | Freq: Once | INTRAMUSCULAR | Status: AC
  Administered 2020-02-01: 4 mg via INTRAVENOUS

## 2020-02-01 MED ORDER — DILTIAZEM HCL 25 MG/5ML IV SOLN
10.0000 mg | Freq: Once | INTRAVENOUS | Status: AC
  Administered 2020-02-01: 10 mg via INTRAVENOUS

## 2020-02-01 MED ORDER — DILTIAZEM HCL 25 MG/5ML IV SOLN
5.0000 mg | Freq: Once | INTRAVENOUS | Status: AC
  Administered 2020-02-01: 5 mg via INTRAVENOUS

## 2020-02-01 NOTE — Progress Notes (Signed)
Patient stated upon arrival felt some abdominal bloating and nausea.

## 2020-02-01 NOTE — Progress Notes (Signed)
Patient tolerated CT well. Nausea improved drank water and ate crackers after. Ambulated to exit steady gait.

## 2020-03-27 ENCOUNTER — Other Ambulatory Visit: Payer: Self-pay | Admitting: Obstetrics and Gynecology

## 2020-03-27 DIAGNOSIS — Z1231 Encounter for screening mammogram for malignant neoplasm of breast: Secondary | ICD-10-CM

## 2020-04-24 ENCOUNTER — Other Ambulatory Visit: Payer: Self-pay

## 2020-04-24 ENCOUNTER — Ambulatory Visit
Admission: RE | Admit: 2020-04-24 | Discharge: 2020-04-24 | Disposition: A | Discharge status: Home / Self Care | Payer: BC Managed Care – PPO | Source: Ambulatory Visit | Attending: Obstetrics and Gynecology | Admitting: Obstetrics and Gynecology

## 2020-04-24 DIAGNOSIS — Z1231 Encounter for screening mammogram for malignant neoplasm of breast: Secondary | ICD-10-CM | POA: Diagnosis not present

## 2020-04-30 NOTE — Progress Notes (Signed)
[place] [address] [place] Phone: [phone] Subjective:   I [name] am serving as a Education administrator for Dr. Hulan Saas.  This visit occurred during the SARS-CoV-2 public health emergency.  Safety protocols were in place, including screening questions prior to the visit, additional usage of staff PPE, and extensive cleaning of exam room while observing appropriate contact time as indicated for disinfecting solutions.   I'm seeing this patient by the request  of:  Kirk Ruths, MD  CC: Low back pain  [id number]  Teresa Moreno is a 59 y.o. female coming in with complaint of low back pain. Patient states the pain is chronic. States she can't do anything. States it is arthritis. Pain radiates to the hip on the left side. Was getting back adjustments and stopped August 31st. Was causing the hips to hurt. Numbness and tingling in the right thigh that is chronic.   Onset-greater than 2 months ago Location - low back, left sided  Duration-  Character- sharp, stiff Aggravating factors- laying flat Reliving factors- squats Therapies tried- Aleve, ice makes the back stiffer, [place] gel 3 times a day  Severity-7 out of 10     Past Medical History:  Diagnosis Date  . basal cell   . GERD (gastroesophageal reflux disease)   . Hypertension    Past Surgical History:  Procedure Laterality Date  . CHOLECYSTECTOMY    . FRACTURE SURGERY     Social History   Socioeconomic History  . Marital status: Widowed    Spouse name: Not on file  . Number of children: Not on file  . Years of education: Not on file  . Highest education level: Not on file  Occupational History  . Not on file  Tobacco Use  . Smoking status: Never Smoker  . Smokeless tobacco: Never Used  Vaping Use  . Vaping Use: Never used  Substance and Sexual Activity  . Alcohol use: Yes    Alcohol/week: 6.0 standard drinks    Types: 4 Shots of liquor, 2 Glasses  of wine per week  . Drug use: No  . Sexual activity: Not on file  Other Topics Concern  . Not on file  Social History Narrative  . Not on file   Social Determinants of Health   Financial Resource Strain:   . Difficulty of Paying Living Expenses: Not on file  Food Insecurity:   . Worried About Charity fundraiser in the Last Year: Not on file  . Ran Out of Food in the Last Year: Not on file  Transportation Needs:   . Lack of Transportation (Medical): Not on file  . Lack of Transportation (Non-Medical): Not on file  Physical Activity:   . Days of Exercise per Week: Not on file  . Minutes of Exercise per Session: Not on file  Stress:   . Feeling of Stress : Not on file  Social Connections:   . Frequency of Communication with Friends and Family: Not on file  . Frequency of Social Gatherings with Friends and Family: Not on file  . Attends Religious Services: Not on file  . Active Member of Clubs or Organizations: Not on file  . Attends Archivist Meetings: Not on file  . Marital Status: Not on file   Allergies  Allergen Reactions  . Codeine Rash   Family History  Problem Relation Age of Onset  . Breast cancer Maternal Aunt 72    Current  Outpatient Medications (Endocrine & Metabolic):  .  norethindrone (MICRONOR,CAMILA,ERRIN) 0.35 MG tablet,   Current Outpatient Medications (Cardiovascular):  .  amLODipine (NORVASC) 5 MG tablet,  .  metoprolol tartrate (LOPRESSOR) 100 MG tablet, Take 1 tablet (100mg ) 2 hours before your cardiac CT scan.   Current Outpatient Medications (Analgesics):  .  aspirin EC 81 MG tablet, Take 81 mg by mouth. .  celecoxib (CELEBREX) 200 MG capsule, Take by mouth. .  meloxicam (MOBIC) 7.5 MG tablet, Take 1 tablet (7.5 mg total) by mouth daily.   Current Outpatient Medications (Other):  .  cephALEXin (KEFLEX) 500 MG capsule, Take 1 capsule (500 mg total) by mouth 2 (two) times daily. .  Flaxseed, Linseed, (FLAX SEED OIL) 1000 MG CAPS,  Take by mouth. .  mometasone (ELOCON) 0.1 % cream, APPLY SPARINGLY AND RUB IN WELL TO AFFECTED AREAS TWICE A DAY .  Multiple Vitamin (MULTI-VITAMINS) TABS, Take by mouth. .  Multiple Vitamins-Minerals (MULTIVITAMIN ADULT PO), Take by mouth. .  Omega-3 1000 MG CAPS, Take by mouth. Marland Kitchen  omeprazole (PRILOSEC) 40 MG capsule, Take 40 mg daily by mouth. .  pantoprazole (PROTONIX) 40 MG tablet,  .  gabapentin (NEURONTIN) 100 MG capsule, Take 2 capsules (200 mg total) by mouth at bedtime.   Reviewed prior external information including notes and imaging from  primary care provider As well as notes that were available from care everywhere and other healthcare systems.  Past medical history, social, surgical and family history all reviewed in electronic medical record.  No pertanent information unless stated regarding to the chief complaint.   Review of Systems:  No headache, visual changes, nausea, vomiting, diarrhea, constipation, dizziness, abdominal pain, skin rash, fevers, chills, night sweats, weight loss, swollen lymph nodes, body aches, joint swelling, chest pain, shortness of breath, mood changes. POSITIVE muscle aches  Objective  Blood pressure 140/90, pulse 75, height 5\' 6"  (1.676 m), weight 250 lb (113.4 kg), last menstrual period 01/14/2015, SpO2 97 %.   General: No apparent distress alert and oriented x3 mood and affect normal, dressed appropriately.  HEENT: Pupils equal, extraocular movements intact  Respiratory: Patient's speak in full sentences and does not appear short of breath  Cardiovascular: No lower extremity edema, non tender, no erythema  Low back exam does have loss of lordosis.  Significant tightness with straight leg test with mild radicular patient does have mild tightness with FABER test.  Neurovascularly intact distally.  5 out of 5 strength in lower extremities.  Limited range of motion secondary to tightness in the back.    Impression and Recommendations:     The  above documentation has been reviewed and is accurate and complete Teresa Pulley, DO

## 2020-05-01 ENCOUNTER — Ambulatory Visit (INDEPENDENT_AMBULATORY_CARE_PROVIDER_SITE_OTHER): Payer: BC Managed Care – PPO

## 2020-05-01 ENCOUNTER — Encounter: Payer: Self-pay | Admitting: Family Medicine

## 2020-05-01 ENCOUNTER — Ambulatory Visit: Payer: BC Managed Care – PPO | Admitting: Family Medicine

## 2020-05-01 ENCOUNTER — Other Ambulatory Visit: Payer: Self-pay

## 2020-05-01 VITALS — BP 140/90 | HR 75 | Ht 66.0 in | Wt 250.0 lb

## 2020-05-01 DIAGNOSIS — G8929 Other chronic pain: Secondary | ICD-10-CM | POA: Diagnosis not present

## 2020-05-01 DIAGNOSIS — M5416 Radiculopathy, lumbar region: Secondary | ICD-10-CM | POA: Insufficient documentation

## 2020-05-01 DIAGNOSIS — M545 Low back pain, unspecified: Secondary | ICD-10-CM

## 2020-05-01 HISTORY — DX: Radiculopathy, lumbar region: M54.16

## 2020-05-01 MED ORDER — GABAPENTIN 100 MG PO CAPS
200.0000 mg | ORAL_CAPSULE | Freq: Every day | ORAL | 3 refills | Status: DC
Start: 2020-05-01 — End: 2020-07-09

## 2020-05-01 MED ORDER — MELOXICAM 7.5 MG PO TABS
7.5000 mg | ORAL_TABLET | Freq: Every day | ORAL | 0 refills | Status: DC
Start: 2020-05-01 — End: 2020-05-06

## 2020-05-01 NOTE — Assessment & Plan Note (Addendum)
Patient does have signs and symptoms more consistent with a lumbar radiculopathy.  Patient has had a history of degenerative disc disease seen on MRI in 2014.  Patient's pain is consistent with an L4-L5 and L5-S1.  X-rays ordered today.  Patient could be a candidate for osteopathic manipulation but at the moment the muscles are too tight at the moment.  We discussed icing regimen.  Follow-up with me again in 3 to 5 weeks referred to physical therapy.

## 2020-05-01 NOTE — Patient Instructions (Addendum)
Good to see you Xray today Meloxicam daily for 10 days Gabapentin 200 mg at night PT will call you Do not use NSAIDS such as Advil or Aleve when taking Meloxicam It is ok to use Tylenol for additional pain relief See me again in 3-5 weeks

## 2020-05-03 ENCOUNTER — Telehealth: Payer: Self-pay

## 2020-05-03 NOTE — Telephone Encounter (Signed)
Patient states that she is not going to be able to take the meloxicam as it is makes her nauseous and her stomach cramp. States that she has had Celebrex in the past from her PCP for her leg that did help was not sure if that is an option. Patient is leery about starting the gabapentin as well but is going to try it this weekend to see how it makes her feel and will let us know Monday when we call back.

## 2020-05-06 MED ORDER — CELECOXIB 200 MG PO CAPS: 200.0000 mg | ORAL_CAPSULE | Freq: Every day | ORAL | 1 refills | Status: DC

## 2020-05-06 NOTE — Telephone Encounter (Signed)
Left message to call back  

## 2020-05-06 NOTE — Addendum Note (Signed)
Addended by: Lyndal Pulley on: 05/06/2020 07:38 AM   Modules accepted: Orders

## 2020-05-06 NOTE — Telephone Encounter (Signed)
Tell her to discontinue the meloxicam and any other anti-inflammatory  Sent in celebrex as well to start daily and see. Think gabapentin will be helpful

## 2020-05-07 NOTE — Telephone Encounter (Signed)
Patient called back. Gave MD response. She expressed understanding.

## 2020-05-27 ENCOUNTER — Other Ambulatory Visit: Payer: Self-pay

## 2020-05-27 ENCOUNTER — Encounter: Payer: Self-pay | Admitting: Family Medicine

## 2020-05-27 ENCOUNTER — Ambulatory Visit: Payer: BC Managed Care – PPO | Admitting: Family Medicine

## 2020-05-27 VITALS — BP 122/82 | HR 74 | Ht 66.0 in | Wt 250.0 lb

## 2020-05-27 DIAGNOSIS — G8929 Other chronic pain: Secondary | ICD-10-CM | POA: Diagnosis not present

## 2020-05-27 DIAGNOSIS — M545 Low back pain, unspecified: Secondary | ICD-10-CM

## 2020-05-27 DIAGNOSIS — M5416 Radiculopathy, lumbar region: Secondary | ICD-10-CM | POA: Diagnosis not present

## 2020-05-27 MED ORDER — TIZANIDINE HCL 2 MG PO TABS: 2.0000 mg | ORAL_TABLET | Freq: Every day | ORAL | 0 refills | Status: DC

## 2020-05-27 NOTE — Patient Instructions (Signed)
Good to see you  Exercises given MRI of lumbar spine ordered call [phone] to schedule Tizanidine sent to pharmacy take daily at bedtime See me again in 5-6 weeks

## 2020-05-27 NOTE — Progress Notes (Signed)
[facility] [place] Phone: [phone] Subjective:   Teresa Moreno, am serving as a scribe for Dr. Hulan Saas. This visit occurred during the SARS-CoV-2 public health emergency.  Safety protocols were in place, including screening questions prior to the visit, additional usage of staff PPE, and extensive cleaning of exam room while observing appropriate contact time as indicated for disinfecting solutions.   I'm seeing this patient by the request  of:  Kirk Ruths, MD  CC: Low back pain follow-up  [id number]   05/01/2020 Patient does have signs and symptoms more consistent with a lumbar radiculopathy.  Patient has had a history of degenerative disc disease seen on MRI in 2014.  Patient's pain is consistent with an L4-L5 and L5-S1.  X-rays ordered today.  Patient could be a candidate for osteopathic manipulation but at the moment the muscles are too tight at the moment.  We discussed icing regimen.  Follow-up with me again in 3 to 5 weeks referred to physical therapy.  Update 05/27/2020 Teresa Moreno is a 59 y.o. female coming in with complaint of low back pain. Patient states back is a little better now that she is taking the celebrex, still worse at night especially when she has to get up. Patient states she does kind of taking the gabapentin but it makes her fingers swell and "foggy" headed. Patient states will sometime take a celebrex at night instead.  Patient was stated that if anything only minorly better overall.   Patient had difficulty with the meloxicam and switch to Celebrex.  Patient did have x-rays lumbar spine taken at last exam x-rays were independently visualized by me showing the patient had moderate degenerative disc disease mostly at L4-L5    Past Medical History:  Diagnosis Date  . basal cell   . GERD (gastroesophageal reflux disease)   . Hypertension    Past Surgical History:    Procedure Laterality Date  . CHOLECYSTECTOMY    . FRACTURE SURGERY     Social History   Socioeconomic History  . Marital status: Widowed    Spouse name: Not on file  . Number of children: Not on file  . Years of education: Not on file  . Highest education level: Not on file  Occupational History  . Not on file  Tobacco Use  . Smoking status: Never Smoker  . Smokeless tobacco: Never Used  Vaping Use  . Vaping Use: Never used  Substance and Sexual Activity  . Alcohol use: Yes    Alcohol/week: 6.0 standard drinks    Types: 4 Shots of liquor, 2 Glasses of wine per week  . Drug use: No  . Sexual activity: Not on file  Other Topics Concern  . Not on file  Social History Narrative  . Not on file   Social Determinants of Health   Financial Resource Strain:   . Difficulty of Paying Living Expenses: Not on file  Food Insecurity:   . Worried About Charity fundraiser in the Last Year: Not on file  . Ran Out of Food in the Last Year: Not on file  Transportation Needs:   . Lack of Transportation (Medical): Not on file  . Lack of Transportation (Non-Medical): Not on file  Physical Activity:   . Days of Exercise per Week: Not on file  . Minutes of Exercise per Session: Not on file  Stress:   . Feeling of Stress : Not on  file  Social Connections:   . Frequency of Communication with Friends and Family: Not on file  . Frequency of Social Gatherings with Friends and Family: Not on file  . Attends Religious Services: Not on file  . Active Member of Clubs or Organizations: Not on file  . Attends Archivist Meetings: Not on file  . Marital Status: Not on file   Allergies  Allergen Reactions  . Codeine Rash   Family History  Problem Relation Age of Onset  . Breast cancer Maternal Aunt 72    Current Outpatient Medications (Endocrine & Metabolic):  .  norethindrone (MICRONOR,CAMILA,ERRIN) 0.35 MG tablet,   Current Outpatient Medications (Cardiovascular):  .   amLODipine (NORVASC) 5 MG tablet,  .  metoprolol tartrate (LOPRESSOR) 100 MG tablet, Take 1 tablet (100mg ) 2 hours before your cardiac CT scan.   Current Outpatient Medications (Analgesics):  .  aspirin EC 81 MG tablet, Take 81 mg by mouth. .  celecoxib (CELEBREX) 200 MG capsule, Take 1 capsule (200 mg total) by mouth daily.   Current Outpatient Medications (Other):  Marland Kitchen  Flaxseed, Linseed, (FLAX SEED OIL) 1000 MG CAPS, Take by mouth. .  gabapentin (NEURONTIN) 100 MG capsule, Take 2 capsules (200 mg total) by mouth at bedtime. .  mometasone (ELOCON) 0.1 % cream, APPLY SPARINGLY AND RUB IN WELL TO AFFECTED AREAS TWICE A DAY .  Multiple Vitamin (MULTI-VITAMINS) TABS, Take by mouth. .  Multiple Vitamins-Minerals (MULTIVITAMIN ADULT PO), Take by mouth. .  Omega-3 1000 MG CAPS, Take by mouth. Marland Kitchen  omeprazole (PRILOSEC) 40 MG capsule, Take 40 mg daily by mouth. .  pantoprazole (PROTONIX) 40 MG tablet,  .  tiZANidine (ZANAFLEX) 2 MG tablet, Take 1 tablet (2 mg total) by mouth at bedtime.   Reviewed prior external information including notes and imaging from  primary care provider As well as notes that were available from care everywhere and other healthcare systems.  Past medical history, social, surgical and family history all reviewed in electronic medical record.  No pertanent information unless stated regarding to the chief complaint.   Review of Systems:  No headache, visual changes, nausea, vomiting, diarrhea, constipation, dizziness, abdominal pain, skin rash, fevers, chills, night sweats, weight loss, swollen lymph nodes, body aches, joint swelling, chest pain, shortness of breath, mood changes. POSITIVE muscle aches  Objective  Blood pressure 122/82, pulse 74, height 5\' 6"  (1.676 m), weight 250 lb (113.4 kg), last menstrual period 01/14/2015, SpO2 99 %.   General: No apparent distress alert and oriented x3 mood and affect normal, dressed appropriately.  HEENT: Pupils equal,  extraocular movements intact  Respiratory: Patient's speak in full sentences and does not appear short of breath  Cardiovascular: No lower extremity edema, non tender, no erythema  Low back exam shows the patient does have some mild loss of lordosis.  Some positive soft radicular symptoms with straight leg test on the right side.  Patient does have maybe some mild weakness even noted of the right lower extremity with the plantar flexion.  Very difficult to tell though.  Deep tendon reflexes though does appear to be intact.  Patient is comfortable sitting but does seem to want to continue to change position regularly.    Impression and Recommendations:     The above documentation has been reviewed and is accurate and complete Lyndal Pulley, DO

## 2020-05-27 NOTE — Assessment & Plan Note (Signed)
Patient continues to have more radicular symptoms.  X-rays did show the patient has moderate degenerative disc disease at L4-L5 that is consistent with patient's symptoms.  Continues to have a positive straight leg test.  Unable to tolerate the gabapentin and given a very low dose of Zanaflex.  Warned of potential side effects.  Can continue the Celebrex.  Patient could be a candidate for epidurals of the back as well as consider Cymbalta.  Follow-up again in 5 to 6 weeks.  Patient work with Product/process development scientist to learn home exercises in greater detail as well.

## 2020-05-29 ENCOUNTER — Other Ambulatory Visit: Payer: Self-pay | Admitting: Family Medicine

## 2020-06-17 ENCOUNTER — Telehealth: Payer: Self-pay | Admitting: Family Medicine

## 2020-06-17 NOTE — Telephone Encounter (Signed)
Pt requesting refill of Celebrex to [facility]

## 2020-06-18 ENCOUNTER — Ambulatory Visit
Admission: RE | Admit: 2020-06-18 | Discharge: 2020-06-18 | Disposition: A | Discharge status: Home / Self Care | Payer: BC Managed Care – PPO | Source: Ambulatory Visit | Attending: Family Medicine | Admitting: Family Medicine

## 2020-06-18 DIAGNOSIS — M545 Low back pain, unspecified: Secondary | ICD-10-CM

## 2020-06-18 NOTE — Telephone Encounter (Signed)
Left message for patient to call back to see if she is using medication. Rx filled on 05/29/2020. Requesting Rx earlier than Dr. Tamala Julian anticipated especially with another refill on script as well.

## 2020-06-18 NOTE — Telephone Encounter (Signed)
Patient did not realize there was a refill on her medication. Picked that up today.

## 2020-06-20 ENCOUNTER — Other Ambulatory Visit: Payer: Self-pay

## 2020-06-20 DIAGNOSIS — M545 Low back pain, unspecified: Secondary | ICD-10-CM

## 2020-06-27 ENCOUNTER — Other Ambulatory Visit: Payer: Self-pay | Admitting: Family Medicine

## 2020-07-03 ENCOUNTER — Ambulatory Visit
Admission: RE | Admit: 2020-07-03 | Discharge: 2020-07-03 | Disposition: A | Discharge status: Home / Self Care | Payer: BC Managed Care – PPO | Source: Ambulatory Visit | Attending: Family Medicine | Admitting: Family Medicine

## 2020-07-03 DIAGNOSIS — G8929 Other chronic pain: Secondary | ICD-10-CM

## 2020-07-03 MED ORDER — METHYLPREDNISOLONE ACETATE 40 MG/ML INJ SUSP (RADIOLOG
120.0000 mg | Freq: Once | INTRAMUSCULAR | Status: AC
  Administered 2020-07-03: 120 mg via EPIDURAL

## 2020-07-03 MED ORDER — IOPAMIDOL (ISOVUE-M 200) INJECTION 41%
1.0000 mL | Freq: Once | INTRAMUSCULAR | Status: AC
  Administered 2020-07-03: 1 mL via EPIDURAL

## 2020-07-03 NOTE — Discharge Instructions (Signed)

## 2020-07-09 ENCOUNTER — Other Ambulatory Visit: Payer: Self-pay

## 2020-07-09 ENCOUNTER — Ambulatory Visit: Payer: Self-pay

## 2020-07-09 ENCOUNTER — Encounter: Payer: Self-pay | Admitting: Family Medicine

## 2020-07-09 ENCOUNTER — Ambulatory Visit: Payer: BC Managed Care – PPO | Admitting: Family Medicine

## 2020-07-09 VITALS — BP 160/90 | HR 69 | Ht 66.0 in | Wt 247.0 lb

## 2020-07-09 DIAGNOSIS — M79671 Pain in right foot: Secondary | ICD-10-CM

## 2020-07-09 DIAGNOSIS — M25571 Pain in right ankle and joints of right foot: Secondary | ICD-10-CM

## 2020-07-09 DIAGNOSIS — G8929 Other chronic pain: Secondary | ICD-10-CM

## 2020-07-09 DIAGNOSIS — M5416 Radiculopathy, lumbar region: Secondary | ICD-10-CM | POA: Diagnosis not present

## 2020-07-09 DIAGNOSIS — Z6835 Body mass index (BMI) 35.0-35.9, adult: Secondary | ICD-10-CM

## 2020-07-09 HISTORY — DX: Pain in right ankle and joints of right foot: M25.571

## 2020-07-09 NOTE — Assessment & Plan Note (Signed)
Patient has had improvement after the epidural.  Patient does have the degenerative disc disease at L4-L5.  Patient states that she does feel 75 to 80% better.  Patient attempted to discontinue the Celebrex but unfortunately had to go back on it with some plateauing of the improvement.  Patient does have some stomach upset from this medication would like to get off of it.  We discussed we can repeat the epidural if she would like.  Can consider Cymbalta in the follow-up if necessary.  Patient will write [place] in another 1 to 2 weeks and tell us if we need to repeat the epidural and then we will follow-up again in 6 weeks afterwards

## 2020-07-09 NOTE — Progress Notes (Signed)
[facility] [address] [place] Phone: [phone] Subjective:   I [name] am serving as a Education administrator for Dr. Hulan Saas.  This visit occurred during the SARS-CoV-2 public health emergency.  Safety protocols were in place, including screening questions prior to the visit, additional usage of staff PPE, and extensive cleaning of exam room while observing appropriate contact time as indicated for disinfecting solutions.   I'm seeing this patient by the request  of:  Kirk Ruths, MD  CC: Low back pain follow-up  [id number]   05/27/2020 Patient continues to have more radicular symptoms.  X-rays did show the patient has moderate degenerative disc disease at L4-L5 that is consistent with patient's symptoms.  Continues to have a positive straight leg test.  Unable to tolerate the gabapentin and given a very low dose of Zanaflex.  Warned of potential side effects.  Can continue the Celebrex.  Patient could be a candidate for epidurals of the back as well as consider Cymbalta.  Follow-up again in 5 to 6 weeks.  Patient work with Product/process development scientist to learn home exercises in greater detail as well.  Update 07/09/2020 Teresa Moreno is a 60 y.o. female coming in with complaint of lumbar radiculopathy. States she just had the epidural Wednesday. States that Monday she felt some pain. Right foot pain today. Medial upain. History of tendonitis in the heel. Pain radiates to the foot. Painful with walking. States she felt a pop walking recently. 9/10 at its worse.    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 basal cell   . GERD (gastroesophageal reflux disease)   . Hypertension    Past Surgical History:  Procedure  Laterality Date  . CHOLECYSTECTOMY    . FRACTURE SURGERY     Social History   Socioeconomic History  . Marital status: Widowed    Spouse name: Not on file  . Number of children: Not on file  . Years of education: Not on file  . Highest education level: Not on file  Occupational History  . Not on file  Tobacco Use  . Smoking status: Never Smoker  . Smokeless tobacco: Never Used  Vaping Use  . Vaping Use: Never used  Substance and Sexual Activity  . Alcohol use: Yes    Alcohol/week: 6.0 standard drinks    Types: 4 Shots of liquor, 2 Glasses of wine per week  . Drug use: No  . Sexual activity: Not on file  Other Topics Concern  . Not on file  Social History Narrative  . Not on file   Social Determinants of Health   Financial Resource Strain: Not on file  Food Insecurity: Not on file  Transportation Needs: Not on file  Physical Activity: Not on file  Stress: Not on file  Social Connections: Not on file   Allergies  Allergen Reactions  . Codeine Rash   Family History  Problem Relation Age of Onset  . Breast cancer Maternal Aunt 72    Current Outpatient Medications (Endocrine & Metabolic):  .  norethindrone (MICRONOR,CAMILA,ERRIN) 0.35 MG tablet,   Current Outpatient Medications (Cardiovascular):  .  amLODipine (NORVASC) 5 MG tablet,  .  metoprolol tartrate (LOPRESSOR) 100 MG tablet, Take 1 tablet (100mg ) 2 hours  before your cardiac CT scan.   Current Outpatient Medications (Analgesics):  .  aspirin EC 81 MG tablet, Take 81 mg by mouth. .  celecoxib (CELEBREX) 200 MG capsule, TAKE 1 CAPSULE BY MOUTH DAILY   Current Outpatient Medications (Other):  Marland Kitchen  Flaxseed, Linseed, (FLAX SEED OIL) 1000 MG CAPS, Take by mouth. .  mometasone (ELOCON) 0.1 % cream, APPLY SPARINGLY AND RUB IN WELL TO AFFECTED AREAS TWICE A DAY .  Multiple Vitamin (MULTI-VITAMINS) TABS, Take by mouth. .  Multiple Vitamins-Minerals (MULTIVITAMIN ADULT PO), Take by mouth. .  Omega-3 1000 MG  CAPS, Take by mouth. Marland Kitchen  omeprazole (PRILOSEC) 40 MG capsule, Take 40 mg daily by mouth. .  pantoprazole (PROTONIX) 40 MG tablet,  .  tiZANidine (ZANAFLEX) 2 MG tablet, TAKE ONE TABLET BY MOUTH AT BEDTIME   Reviewed prior external information including notes and imaging from  primary care provider As well as notes that were available from care everywhere and other healthcare systems.  Past medical history, social, surgical and family history all reviewed in electronic medical record.  No pertanent information unless stated regarding to the chief complaint.   Review of Systems:  No headache, visual changes, nausea, vomiting, diarrhea, constipation, dizziness, abdominal pain, skin rash, fevers, chills, night sweats, weight loss, swollen lymph nodes, body aches, joint swelling, chest pain, shortness of breath, mood changes. POSITIVE muscle aches  Objective  Blood pressure (!) 160/90, pulse 69, height 5\' 6"  (1.676 m), weight 247 lb (112 kg), last menstrual period 01/14/2015, SpO2 99 %.   General: No apparent distress alert and oriented x3 mood and affect normal, dressed appropriately.  HEENT: Pupils equal, extraocular movements intact  Respiratory: Patient's speak in full sentences and does not appear short of breath  Cardiovascular: No lower extremity edema, non tender, no erythema  Back exam no signs of loss of lordosis.  Some tenderness to palpation of the paraspinal musculature.  Negative straight leg test which is an improvement from previous exam.  Neurovascularly intact distally.  Right ankle exam does have some limited dorsiflexion.  Pain on the medial aspect of the ankle mortise.  No pain over the navicular bone.  Patient does not have any pain over the posterior tibialis or the medial malleolus.  Negative Tinel sign.  Limited musculoskeletal ultrasound was performed and interpreted Lyndal Pulley  Limited ultrasound the patient's right foot shows the patient does have moderate  narrowing of the ankle mortise on the medial aspect.  No significant irregularity of the navicular bone noted.  The posterior tibialis tendon appears to be unremarkable Impression: Ankle arthritis    Impression and Recommendations:     The above documentation has been reviewed and is accurate and complete Lyndal Pulley, DO

## 2020-07-09 NOTE — Patient Instructions (Signed)
Right foot and ankle Oofos or hoka sandals in the house Pennsaid or voltaren over the counter Exercise 3 times a week Give the back another week If you need epidural let us know See me again in 8 weeks

## 2020-07-09 NOTE — Assessment & Plan Note (Signed)
Encouraged weight loss 

## 2020-07-09 NOTE — Assessment & Plan Note (Signed)
Patient states has some intermittent right ankle pain.  Has had a history of Achilles tendinitis previously.  We discussed shoes that will help in the house.  Patient's ultrasound today does show some arthritic changes that could be concerning and will get x-rays to further evaluate.  Patient given home exercise, encouraged weight loss, and trial of topical anti-inflammatories and discussed other over-the-counter anti-inflammatories that can be helpful.  Increase activity slowly.  Follow-up with me again in 6 to 8 weeks

## 2020-07-12 ENCOUNTER — Telehealth: Payer: Self-pay | Admitting: Family Medicine

## 2020-07-12 NOTE — Telephone Encounter (Signed)
Not in the office today.  Hard to say but with it being so far out I think it is less likely.  I would monitor but if worsen or the weekend needs to go to urgent care or ER to be evaluated and have maybe labs to see if anything else is contributing.  Maybe call the imaging office and ask them as well if they have seen this before

## 2020-07-12 NOTE — Telephone Encounter (Signed)
Spoke to patient. She expressed understanding.  She said that she did go by the walk in clinic but they were very crowded so she left.  I told her that if she felt she needed to go, then early morning may be best so she would not have to wait as long.  Also told patient to keep [place] posted on how she is doing.

## 2020-07-12 NOTE — Telephone Encounter (Signed)
Patient called stating that since Tuesday, she has been having severe spasms and cramping in her legs and feet. She said that she has been taking magnesium and potassium but it does not seem to help. She was concerned that this could be coming from the Epidural she had done on 07/03/2020? Please advise.

## 2020-07-16 ENCOUNTER — Other Ambulatory Visit: Payer: Self-pay | Admitting: Family Medicine

## 2020-09-03 ENCOUNTER — Ambulatory Visit: Payer: BC Managed Care – PPO | Admitting: Family Medicine

## 2020-10-21 ENCOUNTER — Other Ambulatory Visit (HOSPITAL_COMMUNITY): Payer: Self-pay | Admitting: Unknown Physician Specialty

## 2020-10-21 ENCOUNTER — Other Ambulatory Visit: Payer: Self-pay | Admitting: Unknown Physician Specialty

## 2020-10-21 DIAGNOSIS — R221 Localized swelling, mass and lump, neck: Secondary | ICD-10-CM

## 2020-11-12 ENCOUNTER — Ambulatory Visit
Admission: RE | Admit: 2020-11-12 | Discharge: 2020-11-12 | Disposition: A | Discharge status: Home / Self Care | Payer: BC Managed Care – PPO | Source: Ambulatory Visit | Attending: Unknown Physician Specialty | Admitting: Unknown Physician Specialty

## 2020-11-12 ENCOUNTER — Other Ambulatory Visit: Payer: Self-pay

## 2020-11-12 DIAGNOSIS — R221 Localized swelling, mass and lump, neck: Secondary | ICD-10-CM | POA: Insufficient documentation

## 2021-03-27 ENCOUNTER — Other Ambulatory Visit: Payer: Self-pay | Admitting: Obstetrics and Gynecology

## 2021-03-27 DIAGNOSIS — Z1231 Encounter for screening mammogram for malignant neoplasm of breast: Secondary | ICD-10-CM

## 2021-05-14 ENCOUNTER — Other Ambulatory Visit: Payer: Self-pay

## 2021-05-14 ENCOUNTER — Ambulatory Visit
Admission: RE | Admit: 2021-05-14 | Discharge: 2021-05-14 | Disposition: A | Discharge status: Home / Self Care | Payer: BC Managed Care – PPO | Source: Ambulatory Visit | Attending: Obstetrics and Gynecology | Admitting: Obstetrics and Gynecology

## 2021-05-14 DIAGNOSIS — Z1231 Encounter for screening mammogram for malignant neoplasm of breast: Secondary | ICD-10-CM

## 2021-11-12 ENCOUNTER — Other Ambulatory Visit
Admission: RE | Admit: 2021-11-12 | Discharge: 2021-11-12 | Disposition: A | Discharge status: Home / Self Care | Payer: BC Managed Care – PPO | Source: Ambulatory Visit | Attending: Internal Medicine | Admitting: Internal Medicine

## 2021-11-12 DIAGNOSIS — E7800 Pure hypercholesterolemia, unspecified: Secondary | ICD-10-CM | POA: Diagnosis not present

## 2021-11-12 DIAGNOSIS — I10 Essential (primary) hypertension: Secondary | ICD-10-CM | POA: Insufficient documentation

## 2021-11-12 DIAGNOSIS — R7303 Prediabetes: Secondary | ICD-10-CM | POA: Diagnosis not present

## 2021-11-12 DIAGNOSIS — Z6835 Body mass index (BMI) 35.0-35.9, adult: Secondary | ICD-10-CM | POA: Diagnosis not present

## 2021-11-12 DIAGNOSIS — M898X1 Other specified disorders of bone, shoulder: Secondary | ICD-10-CM | POA: Insufficient documentation

## 2021-11-12 LAB — TROPONIN I (HIGH SENSITIVITY): Troponin I (High Sensitivity): 3 ng/L (ref <18)

## 2021-11-14 ENCOUNTER — Other Ambulatory Visit: Payer: Self-pay

## 2021-11-14 ENCOUNTER — Emergency Department (HOSPITAL_BASED_OUTPATIENT_CLINIC_OR_DEPARTMENT_OTHER)
Admission: EM | Admit: 2021-11-14 | Discharge: 2021-11-14 | Disposition: A | Discharge status: Home / Self Care | Payer: BC Managed Care – PPO | Attending: Emergency Medicine | Admitting: Emergency Medicine

## 2021-11-14 ENCOUNTER — Emergency Department (HOSPITAL_BASED_OUTPATIENT_CLINIC_OR_DEPARTMENT_OTHER): Payer: BC Managed Care – PPO

## 2021-11-14 ENCOUNTER — Encounter (HOSPITAL_BASED_OUTPATIENT_CLINIC_OR_DEPARTMENT_OTHER): Payer: Self-pay

## 2021-11-14 DIAGNOSIS — M545 Low back pain, unspecified: Secondary | ICD-10-CM | POA: Insufficient documentation

## 2021-11-14 DIAGNOSIS — R142 Eructation: Secondary | ICD-10-CM | POA: Insufficient documentation

## 2021-11-14 DIAGNOSIS — R110 Nausea: Secondary | ICD-10-CM | POA: Diagnosis not present

## 2021-11-14 DIAGNOSIS — M549 Dorsalgia, unspecified: Secondary | ICD-10-CM

## 2021-11-14 DIAGNOSIS — Z79899 Other long term (current) drug therapy: Secondary | ICD-10-CM | POA: Insufficient documentation

## 2021-11-14 DIAGNOSIS — R109 Unspecified abdominal pain: Secondary | ICD-10-CM | POA: Diagnosis not present

## 2021-11-14 DIAGNOSIS — Z7982 Long term (current) use of aspirin: Secondary | ICD-10-CM | POA: Diagnosis not present

## 2021-11-14 DIAGNOSIS — R140 Abdominal distension (gaseous): Secondary | ICD-10-CM | POA: Insufficient documentation

## 2021-11-14 LAB — URINALYSIS, ROUTINE W REFLEX MICROSCOPIC
Bilirubin Urine: NEGATIVE
Glucose, UA: NEGATIVE mg/dL
Hgb urine dipstick: NEGATIVE
Ketones, ur: NEGATIVE mg/dL
Nitrite: NEGATIVE
Protein, ur: NEGATIVE mg/dL
Specific Gravity, Urine: <1.005 — ABNORMAL LOW (ref 1.005–1.030)
pH: 5.5 (ref 5.0–8.0)

## 2021-11-14 LAB — BASIC METABOLIC PANEL
Anion gap: 13 (ref 5–15)
BUN: 8 mg/dL (ref 8–23)
CO2: 24 mmol/L (ref 22–32)
Calcium: 9.3 mg/dL (ref 8.9–10.3)
Chloride: 103 mmol/L (ref 98–111)
Creatinine, Ser: 0.69 mg/dL (ref 0.44–1.00)
GFR, Estimated: >60 mL/min (ref >60)
Glucose, Bld: 91 mg/dL (ref 70–99)
Potassium: 3.7 mmol/L (ref 3.5–5.1)
Sodium: 140 mmol/L (ref 135–145)

## 2021-11-14 LAB — CBC WITH DIFFERENTIAL/PLATELET
Abs Immature Granulocytes: 0.04 10*3/uL (ref 0.00–0.07)
Basophils Absolute: 0.1 10*3/uL (ref 0.0–0.1)
Basophils Relative: 1 %
Eosinophils Absolute: 0.1 10*3/uL (ref 0.0–0.5)
Eosinophils Relative: 2 %
HCT: 43.4 % (ref 36.0–46.0)
Hemoglobin: 14 g/dL (ref 12.0–15.0)
Immature Granulocytes: 1 %
Lymphocytes Relative: 32 %
Lymphs Abs: 2.5 10*3/uL (ref 0.7–4.0)
MCH: 28.5 pg (ref 26.0–34.0)
MCHC: 32.3 g/dL (ref 30.0–36.0)
MCV: 88.4 fL (ref 80.0–100.0)
Monocytes Absolute: 0.7 10*3/uL (ref 0.1–1.0)
Monocytes Relative: 9 %
Neutro Abs: 4.6 10*3/uL (ref 1.7–7.7)
Neutrophils Relative %: 55 %
Platelets: 374 10*3/uL (ref 150–400)
RBC: 4.91 MIL/uL (ref 3.87–5.11)
RDW: 13 % (ref 11.5–15.5)
WBC: 8.1 10*3/uL (ref 4.0–10.5)
nRBC: 0 % (ref 0.0–0.2)

## 2021-11-14 MED ORDER — METHOCARBAMOL 500 MG PO TABS: 500.0000 mg | ORAL_TABLET | Freq: Two times a day (BID) | ORAL | 0 refills | Status: AC

## 2021-11-14 NOTE — ED Notes (Signed)
Patient verbalizes understanding of discharge instructions. Opportunity for questioning and answers were provided. Patient discharged from ED.  °

## 2021-11-14 NOTE — ED Provider Notes (Signed)
[facility] Provider Note   CSN: [id number] Arrival date & time: 11/14/21  1229     History  Chief Complaint  Patient presents with   Back Pain    Teresa Moreno is a 61 y.o. female.  61 year old female presents today for evaluation of right-sided back pain that radiates towards the abdomen.  This has been ongoing now for a week.  It is intermittent, sharp in nature.  Denies chest pain, shortness of breath, lightheadedness, palpitations, dysuria, abdominal pain, vomiting.  Endorses occasional nausea.  Does endorse bloating, and belching.  States she has history of gastritis.  Bloating and belching is typical for gastritis flareup.  She does take PPI and reports compliance with this.  Denies personal history of heart disease with the exception of hypertension.  Denies constipation but states she requires MiraLAX.  The history is provided by the patient. No language interpreter was used.      Home Medications Prior to Admission medications   Medication Sig Start Date End Date Taking? Authorizing Provider  amLODipine (NORVASC) 5 MG tablet  02/23/17   [provider]  aspirin EC 81 MG tablet Take 81 mg by mouth.    [provider]  celecoxib (CELEBREX) 200 MG capsule TAKE 1 CAPSULE BY MOUTH DAILY 07/16/20   Lyndal Pulley, DO  Flaxseed, Linseed, (FLAX SEED OIL) 1000 MG CAPS Take by mouth.    [provider]  metoprolol tartrate (LOPRESSOR) 100 MG tablet Take 1 tablet ('100mg'$ ) 2 hours before your cardiac CT scan. 01/31/20   Kate Sable, MD  mometasone (ELOCON) 0.1 % cream APPLY SPARINGLY AND RUB IN WELL TO AFFECTED AREAS TWICE A DAY 02/05/14   [provider]  Multiple Vitamin (MULTI-VITAMINS) TABS Take by mouth.    [provider]  Multiple Vitamins-Minerals (MULTIVITAMIN ADULT PO) Take by mouth.    [provider]  norethindrone (MICRONOR,CAMILA,ERRIN) 0.35 MG tablet  03/31/17   [provider]   Omega-3 1000 MG CAPS Take by mouth.    [provider]  omeprazole (PRILOSEC) 40 MG capsule Take 40 mg daily by mouth.    [provider]  pantoprazole (PROTONIX) 40 MG tablet  04/12/17   [provider]  tiZANidine (ZANAFLEX) 2 MG tablet TAKE ONE TABLET BY MOUTH AT BEDTIME 06/27/20   Lyndal Pulley, DO      Allergies    Codeine    Review of Systems   Review of Systems  Constitutional:  Negative for chills and fever.  Respiratory:  Negative for shortness of breath.   Cardiovascular:  Negative for chest pain.  Gastrointestinal:  Positive for nausea. Negative for abdominal pain, constipation, diarrhea and vomiting.  Neurological:  Negative for weakness and light-headedness.  All other systems reviewed and are negative.  Physical Exam Updated Vital Signs BP 129/85   Pulse 77   Temp 98 F (36.7 C) (Oral)   Resp (!) 8   Ht '5\' 6"'$  (1.676 m)   Wt 115.7 kg   LMP 01/14/2015   SpO2 97%   BMI 41.16 kg/m  Physical Exam Vitals and nursing note reviewed.  Constitutional:      General: She is not in acute distress.    Appearance: Normal appearance. She is not ill-appearing.  HENT:     Head: Normocephalic and atraumatic.     Nose: Nose normal.  Eyes:     General: No scleral icterus.    Extraocular Movements: Extraocular movements intact.     Conjunctiva/sclera:  Conjunctivae normal.  Cardiovascular:     Rate and Rhythm: Normal rate and regular rhythm.     Pulses: Normal pulses.  Pulmonary:     Effort: Pulmonary effort is normal. No respiratory distress.     Breath sounds: Normal breath sounds. No wheezing or rales.  Abdominal:     General: There is no distension.     Palpations: Abdomen is soft.     Tenderness: There is no abdominal tenderness. There is no guarding.  Musculoskeletal:        General: Normal range of motion.     Cervical back: Normal range of motion.  Skin:    General: Skin is warm and dry.  Neurological:     General: No focal  deficit present.     Mental Status: She is alert. Mental status is at baseline.    ED Results / Procedures / Treatments   Labs (all labs ordered are listed, but only abnormal results are displayed) Labs Reviewed  URINALYSIS, ROUTINE W REFLEX MICROSCOPIC - Abnormal; Notable for the following components:      Result Value   Color, Urine COLORLESS (*)    Specific Gravity, Urine <1.005 (*)    Leukocytes,Ua TRACE (*)    All other components within normal limits  CBC WITH DIFFERENTIAL/PLATELET  BASIC METABOLIC PANEL    EKG None  Radiology CT Renal Stone Study  Result Date: 11/14/2021 CLINICAL DATA:  Flank pain, kidney stone suspected; right flank pain EXAM: CT ABDOMEN AND PELVIS WITHOUT CONTRAST TECHNIQUE: Multidetector CT imaging of the abdomen and pelvis was performed following the standard protocol without IV contrast. RADIATION DOSE REDUCTION: This exam was performed according to the departmental dose-optimization program which includes automated exposure control, adjustment of the mA and/or kV according to patient size and/or use of iterative reconstruction technique. COMPARISON:  2013 FINDINGS: Lower chest: Mild atelectasis at the right base. Hepatobiliary: No focal liver abnormality is seen. Status post cholecystectomy. No biliary dilatation. Pancreas: Unremarkable. No pancreatic ductal dilatation or surrounding inflammatory changes. Spleen: Normal in size without focal abnormality. Adrenals/Urinary Tract: Adrenals are unremarkable. No renal calculi or hydronephrosis. Small cyst of the left kidney. Subcentimeter focus of fat density at the upper pole of the left kidney likely reflects an angiomyolipoma. Stomach/Bowel: Stomach is within normal limits. Bowel is normal in caliber. Sigmoid diverticulosis. Vascular/Lymphatic: Minimal atherosclerosis.  No enlarged nodes. Reproductive: No pelvic mass. Other: No free fluid.  Abdominal wall is unremarkable. Musculoskeletal: No acute osseous  abnormality. Degenerative changes of the included spine. IMPRESSION: No acute abnormality.  No urinary tract calculi. 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 Sigmoid diverticulosis. Electronically Signed   By: Macy Mis M.D.   On: 11/14/2021 15:25    Procedures Procedures    Medications Ordered in ED Medications - No data to display  ED Course/ Medical Decision Making/ A&P                           Medical Decision Making Amount and/or Complexity of Data Reviewed Labs: ordered. Radiology: ordered.   61 year old female presents today for evaluation of back pain that is located just below her right scapula that radiates towards her abdomen.  She does have history of cholecystectomy.  Denies symptoms concerning for ACS.  She is requesting to be evaluated for kidney stone.  States she has had history of this.  CBC without leukocytosis or anemia.  BMP unremarkable.  UA without signs of UTI.  CT renal  stone study negative for acute findings.  EKG without acute ischemic changes.  Pain is unlikely to be ACS.  Negative for kidney stone.  Given her history of gastritis, bloating, and belching this is likely GI related.  Discussed aggressive bowel regimen and follow-up with PCP.  Patient voices understanding and is in agreement with plan.   Final Clinical Impression(s) / ED Diagnoses Final diagnoses:  Right-sided back pain, unspecified back location, unspecified chronicity    Rx / DC Orders ED Discharge Orders          Ordered    methocarbamol (ROBAXIN) 500 MG tablet  2 times daily        11/14/21 1635              Evlyn Courier, PA-C 24/46/28 [id number]    Campbell Stall P, DO 77/11/65 1340

## 2021-11-14 NOTE — ED Triage Notes (Signed)
Pt presents to ED from home C/O back pain directly underneath R shoulder pain radiating to R side X 1 week. Pt denies any urinary s/s.

## 2021-11-14 NOTE — Discharge Instructions (Signed)
Your work-up today was reassuring.  No evidence of kidney stone, UTI.  EKG was reassuring.  Your blood work did not show any signs of infection.  I recommend increasing bowel regimen.  You can take up to 4 capfuls of MiraLAX per day and then wean down once you achieve desired effect of your bowel movements.  I have also sent in muscle relaxer for you to use as needed.  If you have any worsening symptoms please return to the emergency room.

## 2021-12-19 ENCOUNTER — Other Ambulatory Visit: Payer: Self-pay | Admitting: Gastroenterology

## 2021-12-19 DIAGNOSIS — R1011 Right upper quadrant pain: Secondary | ICD-10-CM

## 2021-12-25 ENCOUNTER — Ambulatory Visit
Admission: RE | Admit: 2021-12-25 | Discharge: 2021-12-25 | Disposition: A | Discharge status: Home / Self Care | Payer: BC Managed Care – PPO | Source: Ambulatory Visit | Attending: Gastroenterology | Admitting: Gastroenterology

## 2021-12-25 DIAGNOSIS — R1011 Right upper quadrant pain: Secondary | ICD-10-CM | POA: Diagnosis present

## 2022-04-23 ENCOUNTER — Other Ambulatory Visit: Payer: Self-pay | Admitting: Obstetrics and Gynecology

## 2022-04-23 DIAGNOSIS — Z1231 Encounter for screening mammogram for malignant neoplasm of breast: Secondary | ICD-10-CM

## 2022-05-19 ENCOUNTER — Ambulatory Visit
Admission: RE | Admit: 2022-05-19 | Discharge: 2022-05-19 | Disposition: A | Discharge status: Home / Self Care | Payer: BC Managed Care – PPO | Source: Ambulatory Visit | Attending: Obstetrics and Gynecology | Admitting: Obstetrics and Gynecology

## 2022-05-19 DIAGNOSIS — Z1231 Encounter for screening mammogram for malignant neoplasm of breast: Secondary | ICD-10-CM | POA: Diagnosis present

## 2023-01-12 ENCOUNTER — Other Ambulatory Visit: Payer: Self-pay | Admitting: Gastroenterology

## 2023-01-12 DIAGNOSIS — R1010 Upper abdominal pain, unspecified: Secondary | ICD-10-CM

## 2023-01-12 DIAGNOSIS — R140 Abdominal distension (gaseous): Secondary | ICD-10-CM

## 2023-01-13 ENCOUNTER — Other Ambulatory Visit: Payer: Self-pay | Admitting: Gastroenterology

## 2023-01-13 DIAGNOSIS — R1011 Right upper quadrant pain: Secondary | ICD-10-CM

## 2023-01-15 ENCOUNTER — Ambulatory Visit
Admission: RE | Admit: 2023-01-15 | Discharge: 2023-01-15 | Disposition: A | Discharge status: Home / Self Care | Payer: BC Managed Care – PPO | Source: Ambulatory Visit | Attending: Gastroenterology | Admitting: Gastroenterology

## 2023-01-15 DIAGNOSIS — R1011 Right upper quadrant pain: Secondary | ICD-10-CM

## 2023-01-15 MED ORDER — IOPAMIDOL (ISOVUE-300) INJECTION 61%
100.0000 mL | Freq: Once | INTRAVENOUS | Status: AC | PRN
  Administered 2023-01-15: 100 mL via INTRAVENOUS

## 2023-01-18 ENCOUNTER — Other Ambulatory Visit: Payer: BC Managed Care – PPO

## 2023-01-19 ENCOUNTER — Other Ambulatory Visit: Payer: Self-pay | Admitting: Obstetrics and Gynecology

## 2023-01-19 DIAGNOSIS — Z1231 Encounter for screening mammogram for malignant neoplasm of breast: Secondary | ICD-10-CM

## 2023-01-26 ENCOUNTER — Emergency Department (HOSPITAL_BASED_OUTPATIENT_CLINIC_OR_DEPARTMENT_OTHER): Payer: BC Managed Care – PPO | Admitting: Radiology

## 2023-01-26 ENCOUNTER — Other Ambulatory Visit: Payer: Self-pay

## 2023-01-26 ENCOUNTER — Emergency Department (HOSPITAL_BASED_OUTPATIENT_CLINIC_OR_DEPARTMENT_OTHER)
Admission: EM | Admit: 2023-01-26 | Discharge: 2023-01-26 | Disposition: A | Discharge status: Home / Self Care | Payer: BC Managed Care – PPO | Attending: Emergency Medicine | Admitting: Emergency Medicine

## 2023-01-26 ENCOUNTER — Encounter (HOSPITAL_BASED_OUTPATIENT_CLINIC_OR_DEPARTMENT_OTHER): Payer: Self-pay | Admitting: Emergency Medicine

## 2023-01-26 DIAGNOSIS — Z7982 Long term (current) use of aspirin: Secondary | ICD-10-CM | POA: Diagnosis not present

## 2023-01-26 DIAGNOSIS — Z20822 Contact with and (suspected) exposure to covid-19: Secondary | ICD-10-CM | POA: Diagnosis not present

## 2023-01-26 DIAGNOSIS — R1013 Epigastric pain: Secondary | ICD-10-CM | POA: Diagnosis present

## 2023-01-26 DIAGNOSIS — I10 Essential (primary) hypertension: Secondary | ICD-10-CM | POA: Insufficient documentation

## 2023-01-26 DIAGNOSIS — Z79899 Other long term (current) drug therapy: Secondary | ICD-10-CM | POA: Diagnosis not present

## 2023-01-26 DIAGNOSIS — R0602 Shortness of breath: Secondary | ICD-10-CM | POA: Insufficient documentation

## 2023-01-26 LAB — HEPATIC FUNCTION PANEL
ALT: 22 U/L (ref 0–44)
AST: 20 U/L (ref 15–41)
Albumin: 4.6 g/dL (ref 3.5–5.0)
Alkaline Phosphatase: 54 U/L (ref 38–126)
Bilirubin, Direct: 0.1 mg/dL (ref 0.0–0.2)
Indirect Bilirubin: 0.9 mg/dL (ref 0.3–0.9)
Total Bilirubin: 1 mg/dL (ref 0.3–1.2)
Total Protein: 7.7 g/dL (ref 6.5–8.1)

## 2023-01-26 LAB — RESP PANEL BY RT-PCR (RSV, FLU A&B, COVID)  RVPGX2
Influenza A by PCR: NEGATIVE
Influenza B by PCR: NEGATIVE
Resp Syncytial Virus by PCR: NEGATIVE
SARS Coronavirus 2 by RT PCR: NEGATIVE

## 2023-01-26 LAB — BASIC METABOLIC PANEL
Anion gap: 13 (ref 5–15)
BUN: 11 mg/dL (ref 8–23)
CO2: 25 mmol/L (ref 22–32)
Calcium: 9.8 mg/dL (ref 8.9–10.3)
Chloride: 100 mmol/L (ref 98–111)
Creatinine, Ser: 0.74 mg/dL (ref 0.44–1.00)
GFR, Estimated: >60 mL/min (ref >60)
Glucose, Bld: 95 mg/dL (ref 70–99)
Potassium: 4.3 mmol/L (ref 3.5–5.1)
Sodium: 138 mmol/L (ref 135–145)

## 2023-01-26 LAB — CBC
HCT: 44.5 % (ref 36.0–46.0)
Hemoglobin: 14.6 g/dL (ref 12.0–15.0)
MCH: 28.9 pg (ref 26.0–34.0)
MCHC: 32.8 g/dL (ref 30.0–36.0)
MCV: 87.9 fL (ref 80.0–100.0)
Platelets: 348 10*3/uL (ref 150–400)
RBC: 5.06 MIL/uL (ref 3.87–5.11)
RDW: 13.1 % (ref 11.5–15.5)
WBC: 8.5 10*3/uL (ref 4.0–10.5)
nRBC: 0 % (ref 0.0–0.2)

## 2023-01-26 LAB — TROPONIN I (HIGH SENSITIVITY)
Troponin I (High Sensitivity): <2 ng/L (ref <18)
Troponin I (High Sensitivity): 2 ng/L (ref <18)

## 2023-01-26 LAB — LIPASE, BLOOD: Lipase: 19 U/L (ref 11–51)

## 2023-01-26 MED ORDER — PANTOPRAZOLE SODIUM 40 MG IV SOLR
40.0000 mg | Freq: Once | INTRAVENOUS | Status: AC
  Administered 2023-01-26: 40 mg via INTRAVENOUS
  Filled 2023-01-26: qty 10

## 2023-01-26 MED ORDER — ONDANSETRON 4 MG PO TBDP: 4.0000 mg | ORAL_TABLET | Freq: Three times a day (TID) | ORAL | 0 refills | Status: DC | PRN

## 2023-01-26 MED ORDER — LACTATED RINGERS IV BOLUS
1000.0000 mL | Freq: Once | INTRAVENOUS | Status: AC
  Administered 2023-01-26: 1000 mL via INTRAVENOUS

## 2023-01-26 MED ORDER — LIDOCAINE VISCOUS HCL 2 % MT SOLN
15.0000 mL | Freq: Once | OROMUCOSAL | Status: AC
  Administered 2023-01-26: 15 mL via ORAL
  Filled 2023-01-26: qty 15

## 2023-01-26 MED ORDER — ALUM & MAG HYDROXIDE-SIMETH 200-200-20 MG/5ML PO SUSP
30.0000 mL | Freq: Once | ORAL | Status: AC
  Administered 2023-01-26: 30 mL via ORAL
  Filled 2023-01-26: qty 30

## 2023-01-26 NOTE — Discharge Instructions (Addendum)
Your workup today is reassuring.  This is likely gastritis.  No evidence of heart attack.  Continue taking Prilosec twice daily.  I sent Zofran into the pharmacy for you to take as needed.  Follow-up with the gastroenterologist.  For any concerning symptoms return to the emergency room.  Your COVID, flu, RSV test was negative.

## 2023-01-26 NOTE — ED Provider Notes (Signed)
[place] EMERGENCY DEPARTMENT AT [facility] Provider Note   CSN: [id number] Arrival date & time: 01/26/23  1704     History  Chief Complaint  Patient presents with   Abdominal Pain   Shortness of Breath    Teresa Moreno is a 62 y.o. female.  62 year old female presents today for evaluation of epigastric abdominal pain.  Radiates over to the right upper quadrant.  She is status post cholecystectomy.  She states this has been an ongoing issue for the past 3 years.  She is currently taking Prilosec but just darted this yesterday after being off of it for quite some time.  She states her symptoms concerned her for heart attack so she came in for evaluation.  Denies any shortness of breath, jaw pain, radiation of pain to any of the upper extremities.  She also states that some of these symptoms were also present when she had a COVID infection in which she would also like a COVID test.  She does have a gastroenterologist she sees in [place].  She has had previous CT scans, endoscopies which were normal.  Most recently she had a CT scan last Friday.  She states this was normal.  The history is provided by the patient. No language interpreter was used.       Home Medications Prior to Admission medications   Medication Sig Start Date End Date Taking? Authorizing Provider  amLODipine (NORVASC) 5 MG tablet  02/23/17   [provider]  aspirin EC 81 MG tablet Take 81 mg by mouth.    [provider]  celecoxib (CELEBREX) 200 MG capsule TAKE 1 CAPSULE BY MOUTH DAILY 07/16/20   Judi Saa, DO  Flaxseed, Linseed, (FLAX SEED OIL) 1000 MG CAPS Take by mouth.    [provider]  methocarbamol (ROBAXIN) 500 MG tablet Take 1 tablet (500 mg total) by mouth 2 (two) times daily. 11/14/21   Marita Kansas, PA-C  metoprolol tartrate (LOPRESSOR) 100 MG tablet Take 1 tablet (100mg ) 2 hours before your cardiac CT scan. 01/31/20   Debbe Odea, MD  mometasone (ELOCON)  0.1 % cream APPLY SPARINGLY AND RUB IN WELL TO AFFECTED AREAS TWICE A DAY 02/05/14   [provider]  Multiple Vitamin (MULTI-VITAMINS) TABS Take by mouth.    [provider]  Multiple Vitamins-Minerals (MULTIVITAMIN ADULT PO) Take by mouth.    [provider]  norethindrone (MICRONOR,CAMILA,ERRIN) 0.35 MG tablet  03/31/17   [provider]  Omega-3 1000 MG CAPS Take by mouth.    [provider]  omeprazole (PRILOSEC) 40 MG capsule Take 40 mg daily by mouth.    [provider]  pantoprazole (PROTONIX) 40 MG tablet  04/12/17   [provider]      Allergies    Codeine    Review of Systems   Review of Systems  Constitutional:  Negative for fever.  Respiratory:  Negative for shortness of breath.   Cardiovascular:  Negative for chest pain.  Gastrointestinal:  Positive for abdominal pain. Negative for constipation, nausea and vomiting.  Genitourinary:  Negative for dysuria.  Neurological:  Negative for light-headedness.  All other systems reviewed and are negative.   Physical Exam Updated Vital Signs BP (!) 157/103 (BP Location: Right Arm)   Pulse 80   Temp 97.9 F (36.6 C)   Resp 18   LMP 01/14/2015   SpO2 98%  Physical Exam Vitals and nursing note reviewed.  Constitutional:      General:  She is not in acute distress.    Appearance: Normal appearance. She is not ill-appearing.  HENT:     Head: Normocephalic and atraumatic.     Nose: Nose normal.  Eyes:     General: No scleral icterus.    Extraocular Movements: Extraocular movements intact.     Conjunctiva/sclera: Conjunctivae normal.  Cardiovascular:     Rate and Rhythm: Normal rate and regular rhythm.     Pulses: Normal pulses.     Heart sounds: Normal heart sounds.  Pulmonary:     Effort: Pulmonary effort is normal. No respiratory distress.     Breath sounds: Normal breath sounds. No wheezing or rales.  Abdominal:     General: There is no distension.      Tenderness: There is abdominal tenderness. There is no right CVA tenderness, left CVA tenderness or guarding.  Musculoskeletal:        General: Normal range of motion.     Cervical back: Normal range of motion.  Skin:    General: Skin is warm and dry.  Neurological:     General: No focal deficit present.     Mental Status: She is alert. Mental status is at baseline.     ED Results / Procedures / Treatments   Labs (all labs ordered are listed, but only abnormal results are displayed) Labs Reviewed  RESP PANEL BY RT-PCR (RSV, FLU A&B, COVID)  RVPGX2  BASIC METABOLIC PANEL  CBC  LIPASE, BLOOD  HEPATIC FUNCTION PANEL  TROPONIN I (HIGH SENSITIVITY)  TROPONIN I (HIGH SENSITIVITY)    EKG None  Radiology DG Chest 2 View  Result Date: 01/26/2023 CLINICAL DATA:  Shortness of breath EXAM: CHEST - 2 VIEW COMPARISON:  Chest x-ray dated January 06, 2020; CT abdomen and pelvis dated January 15, 2023 FINDINGS: Cardiac and mediastinal contours within normal limits. Mild lingular opacity. No evidence of pleural effusion or pneumothorax. IMPRESSION: Mild lingular opacity, likely due to atelectasis, although infection or aspiration could appear similar. Electronically Signed   By: Allegra Lai M.D.   On: 01/26/2023 18:00    Procedures Procedures    Medications Ordered in ED Medications  lactated ringers bolus 1,000 mL (has no administration in time range)  pantoprazole (PROTONIX) injection 40 mg (has no administration in time range)  alum & mag hydroxide-simeth (MAALOX/MYLANTA) 200-200-20 MG/5ML suspension 30 mL (has no administration in time range)    And  lidocaine (XYLOCAINE) 2 % viscous mouth solution 15 mL (has no administration in time range)    ED Course/ Medical Decision Making/ A&P                                 Medical Decision Making Amount and/or Complexity of Data Reviewed Labs: ordered. Radiology: ordered.  Risk OTC drugs. Prescription drug management.   Medical  Decision Making / ED Course   This patient presents to the ED for concern of abdominal pain, this involves an extensive number of treatment options, and is a complaint that carries with it a high risk of complications and morbidity.  The differential diagnosis includes gastritis, gastroenteritis, colitis, ACS, appendicitis, diverticulitis  MDM: 62 year old female presents with above-mentioned symptoms.  Overall she is well-appearing.  Hemodynamically stable.  Denies melanotic stools.  History of cholecystectomy.  Recently had CT imaging on Friday.  Has been noncompliant with her PPI until yesterday.  Will provide her Protonix, fluids.  She is also requesting COVID test.  Will obtain this.  Will provide GI cocktail.  Workup overall reassuring with unremarkable CBC and BMP.  Lipase within normal limit.  Initial troponin unremarkable.  Chest x-ray without acute cardiopulmonary process.  Hepatic function panel pending.  Hepatic function unremarkable.  Repeat troponin of 2.  Low suspicion for ACS.  Low heart score as well.  COVID pending.  She does report improvement in symptoms on reevaluation.  Discussed continue taking Prilosec twice daily and following up with gastroenterologist.  Will send in nausea medication to take as needed.  Patient is in agreement with this plan.  COVID, flu, RSV test negative.  We discussed imaging however patient just had a CT done on Friday and does not want repeat imaging today.   Lab Tests: -I ordered, reviewed, and interpreted labs.   The pertinent results include:   Labs Reviewed  RESP PANEL BY RT-PCR (RSV, FLU A&B, COVID)  RVPGX2  BASIC METABOLIC PANEL  CBC  LIPASE, BLOOD  HEPATIC FUNCTION PANEL  TROPONIN I (HIGH SENSITIVITY)  TROPONIN I (HIGH SENSITIVITY)      EKG  EKG Interpretation Date/Time:    Ventricular Rate:    PR Interval:    QRS Duration:    QT Interval:    QTC Calculation:   R Axis:      Text Interpretation:           Medicines  ordered and prescription drug management: Meds ordered this encounter  Medications   lactated ringers bolus 1,000 mL   pantoprazole (PROTONIX) injection 40 mg   AND Linked Order Group    alum & mag hydroxide-simeth (MAALOX/MYLANTA) 200-200-20 MG/5ML suspension 30 mL    lidocaine (XYLOCAINE) 2 % viscous mouth solution 15 mL    -I have reviewed the patients home medicines and have made adjustments as needed   Reevaluation: After the interventions noted above, I reevaluated the patient and found that they have :improved  Co morbidities that complicate the patient evaluation  Past Medical History:  Diagnosis Date   basal cell    GERD (gastroesophageal reflux disease)    Hypertension       Dispostion: Patient discharged in stable condition.  Return precaution discussed.  Patient voices understanding and is in agreement with plan.   Final Clinical Impression(s) / ED Diagnoses Final diagnoses:  Epigastric abdominal pain    Rx / DC Orders ED Discharge Orders          Ordered    ondansetron (ZOFRAN-ODT) 4 MG disintegrating tablet  Every 8 hours PRN        01/26/23 2134              Marita Kansas, PA-C 01/26/23 2138    Gloris Manchester, MD 01/26/23 [phone]

## 2023-01-26 NOTE — ED Triage Notes (Signed)
Upper abd pain that goes into chest, sob. Comes and goes Started Friday on and off

## 2023-01-26 NOTE — ED Provider Triage Note (Signed)
Emergency Medicine Provider Triage Evaluation Note  Teresa Moreno , a 62 y.o. female  was evaluated in triage.  Pt complains of RUQ pain ongoing for years, now with epigastric pain that radiates into her back with belching. Epigastric pain is intermittent.  Has had RUQ pain evaluated several times without cause found. Occasional right side CP, intermittent. Nothing makes pain worse, nothing makes pain better.  Endoscopy 2 years ago, normal.   Review of Systems  Positive: Nausea, back pain, abdominal pain, occasional diarrhea Negative: Fevers, chills, vomiting, changes in bladder habits  Physical Exam  BP (!) 157/103 (BP Location: Right Arm)   Pulse 80   Temp 97.9 F (36.6 C)   Resp 18   LMP 01/14/2015   SpO2 98%  Gen:   Awake, no distress   Resp:  Normal effort  MSK:   Moves extremities without difficulty  Other:    Medical Decision Making  Medically screening exam initiated at 5:33 PM.  Appropriate orders placed.  Laverta Baltimore was informed that the remainder of the evaluation will be completed by another provider, this initial triage assessment does not replace that evaluation, and the importance of remaining in the ED until their evaluation is complete.     Jeannie Fend, PA-C 01/26/23 1735

## 2023-04-09 IMAGING — US US SOFT TISSUE HEAD/NECK
1 series · 7 of 7 positions shown · non-contrast
Comparison: None.

CLINICAL DATA: 60-year-old female with neck mass

EXAM:
ULTRASOUND OF HEAD/NECK SOFT TISSUES
TECHNIQUE: Ultrasound examination of the head and neck soft tissues was
performed in the area of clinical concern.

[Series 1: us soft tissue head/neck · 0.06mm/px · 7 of 7 slices shown]
[im 1/7]
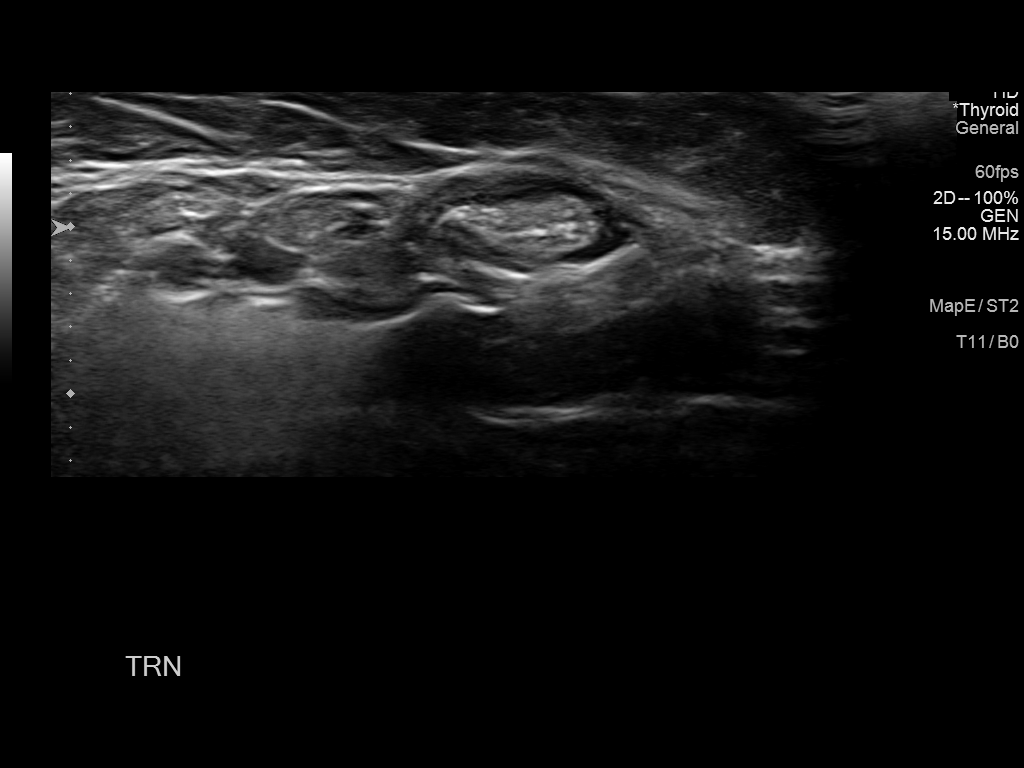
[im 2/7]
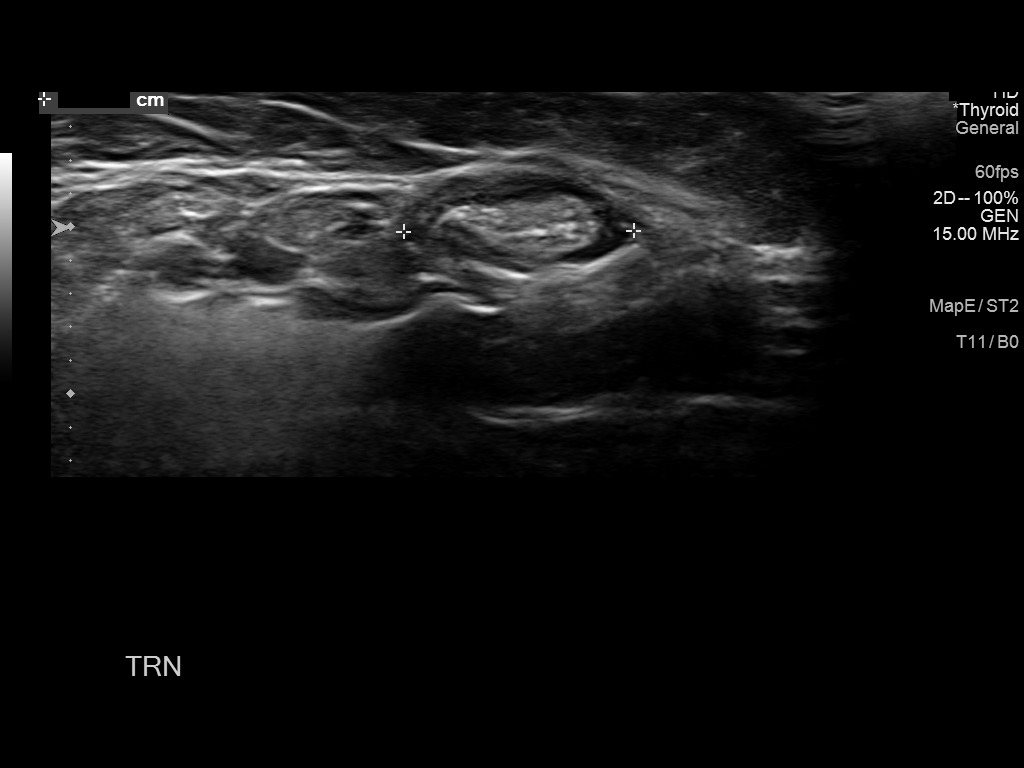
[im 3/7]
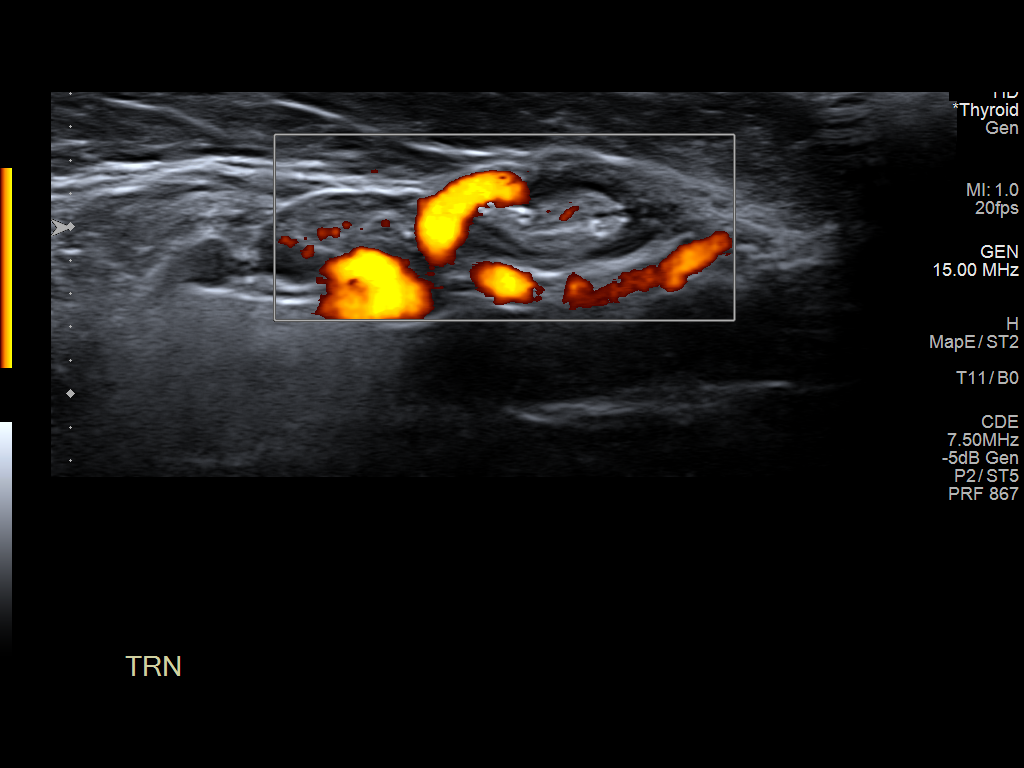
[im 4/7]
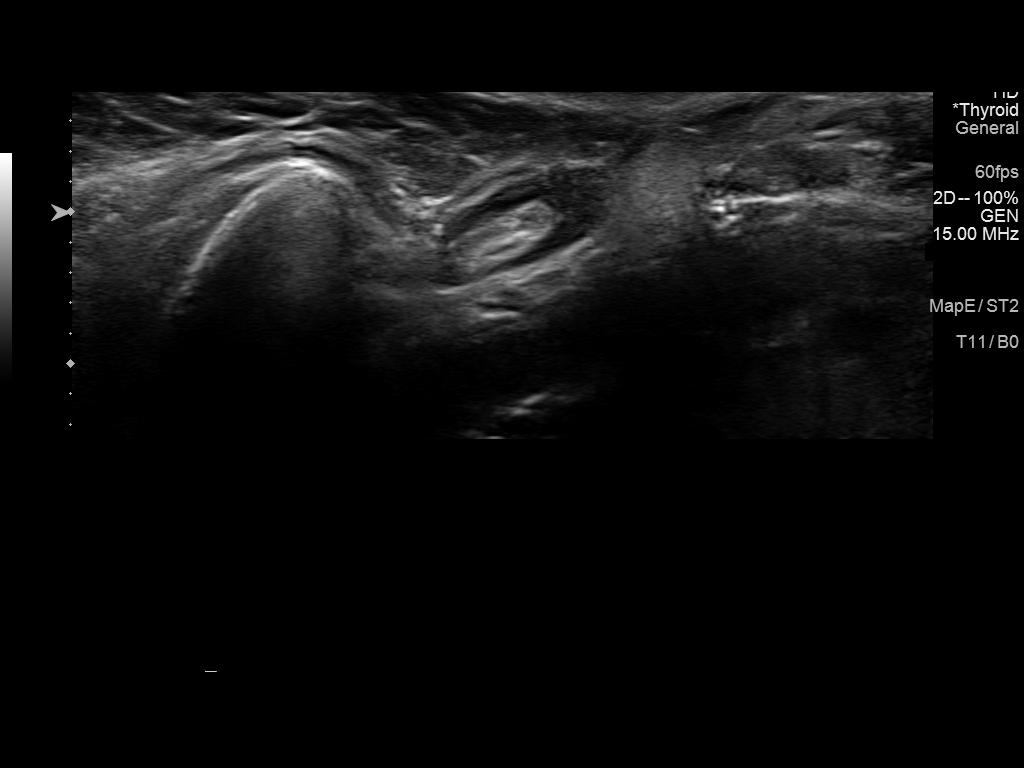
[im 5/7]
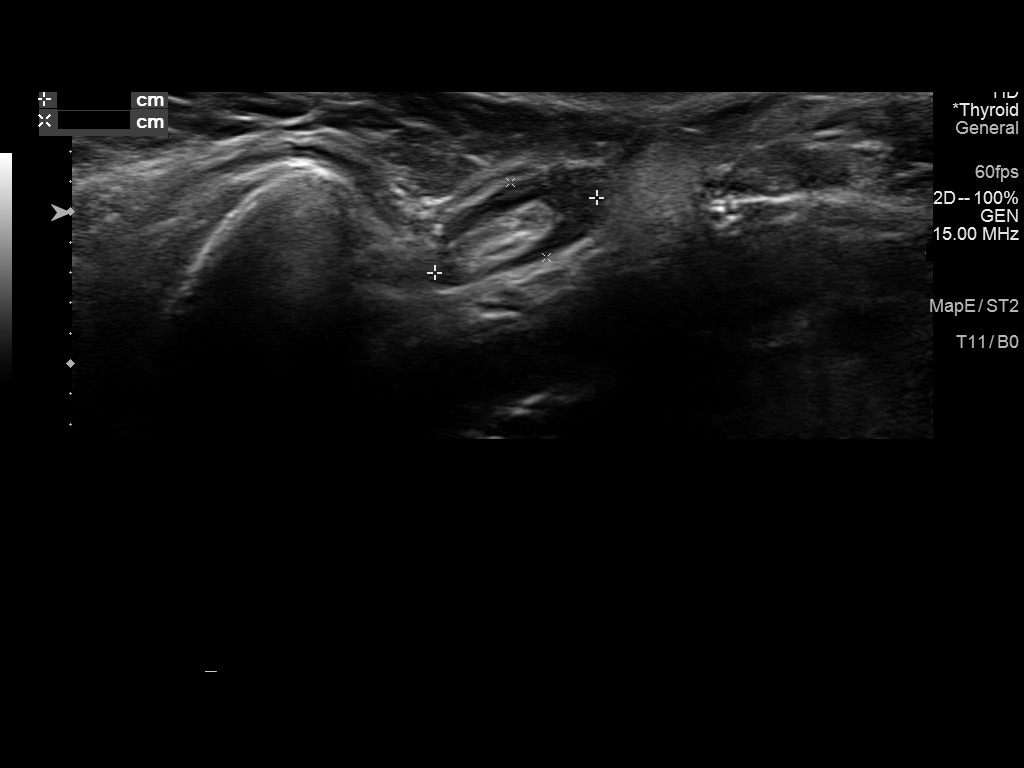
[im 6/7]
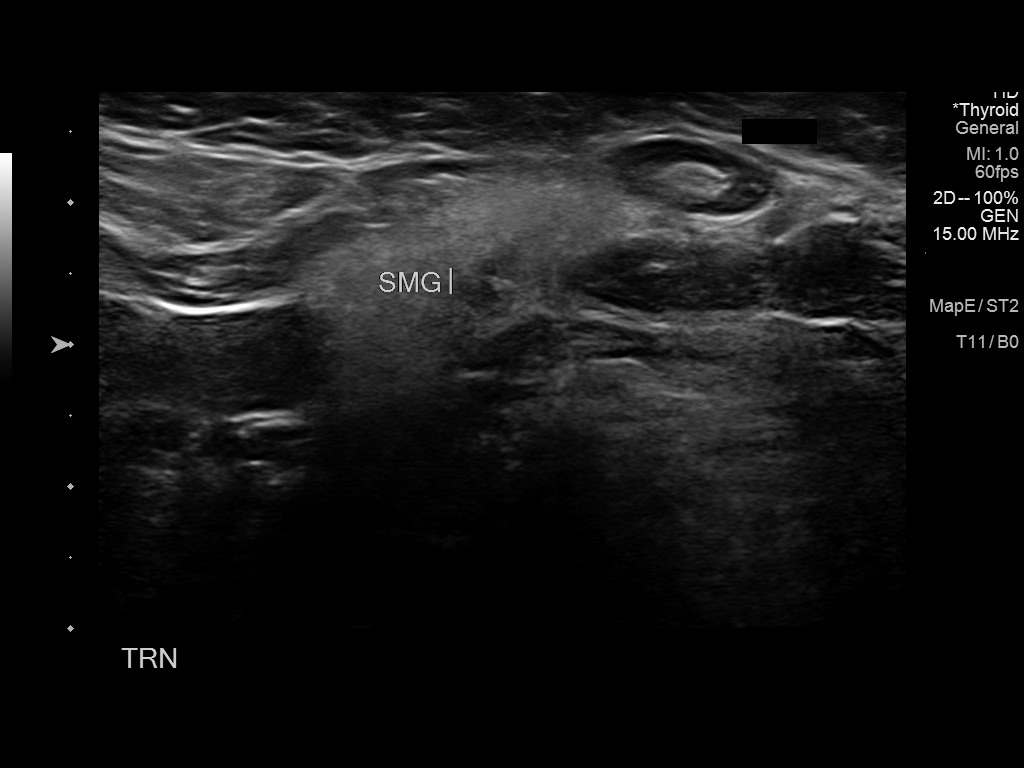
[im 7/7]
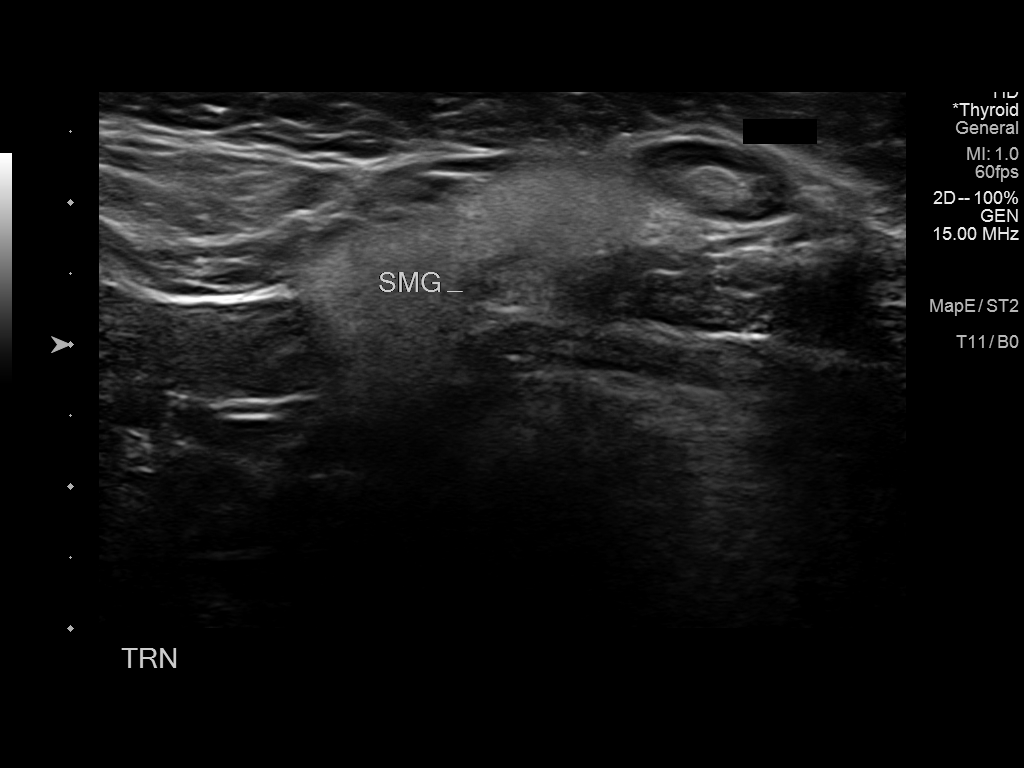

[7 of 7 positions shown; findings below may reference images not displayed]

FINDINGS: Grayscale and color duplex performed in the region of clinical
concern.

Typical appearing lymph node adjacent to the submandibular gland,
not enlarged measuring 6 mm short axis.
IMPRESSION: Typical appearing lymph node in the region clinical concern,
nonspecific though potentially reactive

## 2023-05-21 ENCOUNTER — Ambulatory Visit
Admission: RE | Admit: 2023-05-21 | Discharge: 2023-05-21 | Disposition: A | Discharge status: Home / Self Care | Payer: BC Managed Care – PPO | Source: Ambulatory Visit | Attending: Obstetrics and Gynecology | Admitting: Obstetrics and Gynecology

## 2023-05-21 DIAGNOSIS — Z1231 Encounter for screening mammogram for malignant neoplasm of breast: Secondary | ICD-10-CM | POA: Insufficient documentation

## 2023-08-08 ENCOUNTER — Emergency Department (HOSPITAL_BASED_OUTPATIENT_CLINIC_OR_DEPARTMENT_OTHER): Payer: BC Managed Care – PPO

## 2023-08-08 ENCOUNTER — Emergency Department (HOSPITAL_BASED_OUTPATIENT_CLINIC_OR_DEPARTMENT_OTHER)
Admission: EM | Admit: 2023-08-08 | Discharge: 2023-08-08 | Disposition: A | Discharge status: Home / Self Care | Payer: BC Managed Care – PPO | Attending: Emergency Medicine | Admitting: Emergency Medicine

## 2023-08-08 ENCOUNTER — Other Ambulatory Visit: Payer: Self-pay

## 2023-08-08 DIAGNOSIS — Z79899 Other long term (current) drug therapy: Secondary | ICD-10-CM | POA: Insufficient documentation

## 2023-08-08 DIAGNOSIS — K5732 Diverticulitis of large intestine without perforation or abscess without bleeding: Secondary | ICD-10-CM | POA: Insufficient documentation

## 2023-08-08 DIAGNOSIS — I10 Essential (primary) hypertension: Secondary | ICD-10-CM | POA: Diagnosis not present

## 2023-08-08 DIAGNOSIS — R1032 Left lower quadrant pain: Secondary | ICD-10-CM | POA: Diagnosis present

## 2023-08-08 DIAGNOSIS — K5792 Diverticulitis of intestine, part unspecified, without perforation or abscess without bleeding: Secondary | ICD-10-CM

## 2023-08-08 LAB — COMPREHENSIVE METABOLIC PANEL
ALT: 20 U/L (ref 0–44)
AST: 16 U/L (ref 15–41)
Albumin: 4.3 g/dL (ref 3.5–5.0)
Alkaline Phosphatase: 72 U/L (ref 38–126)
Anion gap: 11 (ref 5–15)
BUN: 13 mg/dL (ref 8–23)
CO2: 25 mmol/L (ref 22–32)
Calcium: 9.4 mg/dL (ref 8.9–10.3)
Chloride: 101 mmol/L (ref 98–111)
Creatinine, Ser: 0.6 mg/dL (ref 0.44–1.00)
GFR, Estimated: >60 mL/min (ref >60)
Glucose, Bld: 108 mg/dL — ABNORMAL HIGH (ref 70–99)
Potassium: 4 mmol/L (ref 3.5–5.1)
Sodium: 137 mmol/L (ref 135–145)
Total Bilirubin: 0.7 mg/dL (ref 0.0–1.2)
Total Protein: 7.8 g/dL (ref 6.5–8.1)

## 2023-08-08 LAB — CBC WITH DIFFERENTIAL/PLATELET
Abs Immature Granulocytes: 0.04 10*3/uL (ref 0.00–0.07)
Basophils Absolute: 0 10*3/uL (ref 0.0–0.1)
Basophils Relative: 0 %
Eosinophils Absolute: 0.1 10*3/uL (ref 0.0–0.5)
Eosinophils Relative: 1 %
HCT: 42.5 % (ref 36.0–46.0)
Hemoglobin: 13.8 g/dL (ref 12.0–15.0)
Immature Granulocytes: 0 %
Lymphocytes Relative: 14 %
Lymphs Abs: 1.6 10*3/uL (ref 0.7–4.0)
MCH: 27.8 pg (ref 26.0–34.0)
MCHC: 32.5 g/dL (ref 30.0–36.0)
MCV: 85.7 fL (ref 80.0–100.0)
Monocytes Absolute: 1 10*3/uL (ref 0.1–1.0)
Monocytes Relative: 9 %
Neutro Abs: 9.2 10*3/uL — ABNORMAL HIGH (ref 1.7–7.7)
Neutrophils Relative %: 76 %
Platelets: 377 10*3/uL (ref 150–400)
RBC: 4.96 MIL/uL (ref 3.87–5.11)
RDW: 13.7 % (ref 11.5–15.5)
WBC: 12 10*3/uL — ABNORMAL HIGH (ref 4.0–10.5)
nRBC: 0 % (ref 0.0–0.2)

## 2023-08-08 LAB — URINALYSIS, ROUTINE W REFLEX MICROSCOPIC
Bilirubin Urine: NEGATIVE
Glucose, UA: NEGATIVE mg/dL
Hgb urine dipstick: NEGATIVE
Ketones, ur: NEGATIVE mg/dL
Leukocytes,Ua: NEGATIVE
Nitrite: NEGATIVE
Specific Gravity, Urine: 1.026 (ref 1.005–1.030)
pH: 5.5 (ref 5.0–8.0)

## 2023-08-08 LAB — LIPASE, BLOOD: Lipase: 22 U/L (ref 11–51)

## 2023-08-08 MED ORDER — IOHEXOL 350 MG/ML SOLN
75.0000 mL | Freq: Once | INTRAVENOUS | Status: AC | PRN
  Administered 2023-08-08: 75 mL via INTRAVENOUS

## 2023-08-08 MED ORDER — ONDANSETRON HCL 4 MG/2ML IJ SOLN
4.0000 mg | Freq: Once | INTRAMUSCULAR | Status: AC
  Administered 2023-08-08: 4 mg via INTRAVENOUS
  Filled 2023-08-08: qty 2

## 2023-08-08 MED ORDER — SODIUM CHLORIDE 0.9 % IV BOLUS
1000.0000 mL | Freq: Once | INTRAVENOUS | Status: AC
  Administered 2023-08-08: 1000 mL via INTRAVENOUS

## 2023-08-08 MED ORDER — KETOROLAC TROMETHAMINE 15 MG/ML IJ SOLN
15.0000 mg | Freq: Once | INTRAMUSCULAR | Status: AC
  Administered 2023-08-08: 15 mg via INTRAVENOUS
  Filled 2023-08-08: qty 1

## 2023-08-08 MED ORDER — AMOXICILLIN-POT CLAVULANATE 875-125 MG PO TABS
1.0000 | ORAL_TABLET | Freq: Once | ORAL | Status: AC
  Administered 2023-08-08: 1 via ORAL
  Filled 2023-08-08: qty 1

## 2023-08-08 MED ORDER — HYDROMORPHONE HCL 1 MG/ML IJ SOLN
1.0000 mg | Freq: Once | INTRAMUSCULAR | Status: AC
  Administered 2023-08-08: 1 mg via INTRAVENOUS
  Filled 2023-08-08: qty 1

## 2023-08-08 MED ORDER — AMOXICILLIN-POT CLAVULANATE 875-125 MG PO TABS: 1.0000 | ORAL_TABLET | Freq: Two times a day (BID) | ORAL | 0 refills | Status: AC

## 2023-08-08 MED ORDER — HYDROCODONE-ACETAMINOPHEN 5-325 MG PO TABS: 1.0000 | ORAL_TABLET | Freq: Four times a day (QID) | ORAL | 0 refills | Status: DC | PRN

## 2023-08-08 NOTE — ED Provider Notes (Signed)
 [place] EMERGENCY DEPARTMENT AT [facility] Provider Note   CSN: [id number] Arrival date & time: 08/08/23  1228     History  Chief Complaint  Patient presents with   Groin Pain   HPI Teresa Moreno is a 63 y.o. female with hypertension and GERD presenting for left lower quadrant abdominal pain.  Started yesterday.  At times radiates down into the left groin and up into the left upper quadrant of her abdomen.  States she vomited 4 times this morning feels nauseous.  States she had a normal bowel movement today and still passing gas.  Denies urinary symptoms but states the pain is similar to when she has had a kidney stone in the past.  Denies abnormal vaginal bleeding or discharge.  Denies fever.   Groin Pain       Home Medications Prior to Admission medications   Medication Sig Start Date End Date Taking? Authorizing Provider  amoxicillin -clavulanate (AUGMENTIN ) 875-125 MG tablet Take 1 tablet by mouth every 12 (twelve) hours for 6 days. 08/08/23 08/14/23 Yes Kimbra Marcelino K, PA-C  HYDROcodone -acetaminophen  (NORCO/VICODIN) 5-325 MG tablet Take 1 tablet by mouth every 6 (six) hours as needed. 08/08/23  Yes Malekai Markwood K, PA-C  amLODipine (NORVASC) 5 MG tablet  02/23/17   [provider]  aspirin EC 81 MG tablet Take 81 mg by mouth.    [provider]  celecoxib  (CELEBREX ) 200 MG capsule TAKE 1 CAPSULE BY MOUTH DAILY 07/16/20   Claudene Arthea HERO, DO  Flaxseed, Linseed, (FLAX SEED OIL) 1000 MG CAPS Take by mouth.    [provider]  methocarbamol  (ROBAXIN ) 500 MG tablet Take 1 tablet (500 mg total) by mouth 2 (two) times daily. 11/14/21   Hildegard Loge, PA-C  metoprolol  tartrate (LOPRESSOR ) 100 MG tablet Take 1 tablet (100mg ) 2 hours before your cardiac CT scan. 01/31/20   Darliss Rogue, MD  mometasone (ELOCON) 0.1 % cream APPLY SPARINGLY AND RUB IN WELL TO AFFECTED AREAS TWICE A DAY 02/05/14   [provider]  Multiple Vitamin (MULTI-VITAMINS)  TABS Take by mouth.    [provider]  Multiple Vitamins-Minerals (MULTIVITAMIN ADULT PO) Take by mouth.    [provider]  norethindrone (MICRONOR,CAMILA,ERRIN) 0.35 MG tablet  03/31/17   [provider]  Omega-3 1000 MG CAPS Take by mouth.    [provider]  omeprazole (PRILOSEC) 40 MG capsule Take 40 mg daily by mouth.    [provider]  ondansetron  (ZOFRAN -ODT) 4 MG disintegrating tablet Take 1 tablet (4 mg total) by mouth every 8 (eight) hours as needed. 01/26/23   Hildegard Loge, PA-C  pantoprazole  (PROTONIX ) 40 MG tablet  04/12/17   [provider]      Allergies    Codeine    Review of Systems   See HPI for pertinent positives  Physical Exam Updated Vital Signs BP 129/65   Pulse 83   Temp 98.1 F (36.7 C) (Oral)   Resp 18   Ht 5' 7 (1.702 m)   Wt 115.7 kg   LMP 01/14/2015   SpO2 98%   BMI 39.94 kg/m  Physical Exam Vitals and nursing note reviewed.  HENT:     Head: Normocephalic and atraumatic.     Mouth/Throat:     Mouth: Mucous membranes are moist.  Eyes:     General:        Right eye: No discharge.        Left eye: No discharge.  Conjunctiva/sclera: Conjunctivae normal.  Cardiovascular:     Rate and Rhythm: Normal rate and regular rhythm.     Pulses: Normal pulses.     Heart sounds: Normal heart sounds.  Pulmonary:     Effort: Pulmonary effort is normal.     Breath sounds: Normal breath sounds.  Abdominal:     General: Abdomen is flat.     Palpations: Abdomen is soft.     Tenderness: There is abdominal tenderness in the suprapubic area, left upper quadrant and left lower quadrant.  Skin:    General: Skin is warm and dry.  Neurological:     General: No focal deficit present.  Psychiatric:        Mood and Affect: Mood normal.     ED Results / Procedures / Treatments   Labs (all labs ordered are listed, but only abnormal results are displayed) Labs Reviewed  URINALYSIS, ROUTINE W REFLEX  MICROSCOPIC - Abnormal; Notable for the following components:      Result Value   Protein, ur TRACE (*)    All other components within normal limits  CBC WITH DIFFERENTIAL/PLATELET - Abnormal; Notable for the following components:   WBC 12.0 (*)    Neutro Abs 9.2 (*)    All other components within normal limits  COMPREHENSIVE METABOLIC PANEL - Abnormal; Notable for the following components:   Glucose, Bld 108 (*)    All other components within normal limits  LIPASE, BLOOD    EKG None  Radiology CT ABDOMEN PELVIS W CONTRAST Result Date: 08/08/2023 CLINICAL DATA:  Abdominal pain. EXAM: CT ABDOMEN AND PELVIS WITH CONTRAST TECHNIQUE: Multidetector CT imaging of the abdomen and pelvis was performed using the standard protocol following bolus administration of intravenous contrast. RADIATION DOSE REDUCTION: This exam was performed according to the departmental dose-optimization program which includes automated exposure control, adjustment of the mA and/or kV according to patient size and/or use of iterative reconstruction technique. CONTRAST:  75mL OMNIPAQUE  IOHEXOL  350 MG/ML SOLN COMPARISON:  CT abdomen pelvis dated 01/15/2023. FINDINGS: Lower chest: The visualized lung bases are clear. No intra-abdominal free air or free fluid. Hepatobiliary: Fatty appearing liver. No biliary dilatation. Cholecystectomy. Pancreas: Unremarkable. No pancreatic ductal dilatation or surrounding inflammatory changes. Spleen: Normal in size without focal abnormality. Adrenals/Urinary Tract: The adrenal glands are unremarkable. Small left renal inferior pole cyst. Subcentimeter left renal upper pole hypodense lesions are too small to characterize but similar to prior CT. There is no hydronephrosis on either side. There is symmetric enhancement and excretion of contrast by both kidneys. The visualized ureters and the urinary bladder appear unremarkable. Stomach/Bowel: There is sigmoid diverticulosis. There is mild stranding  and inflammatory changes adjacent to sigmoid diverticula. No abscess or perforation. There is no bowel obstruction. The appendix is not visualized with certainty. No inflammatory changes identified in the right lower quadrant. Vascular/Lymphatic: Mild aortoiliac atherosclerotic disease. The IVC is unremarkable. No portal venous gas. There is no adenopathy. Reproductive: Small calcified uterine fibroid. No suspicious adnexal masses. Other: None Musculoskeletal: Degenerative changes of the spine. No acute osseous pathology. IMPRESSION: 1. Acute sigmoid diverticulitis. No abscess or perforation. 2. Fatty liver. 3.  Aortic Atherosclerosis (ICD10-I70.0). Electronically Signed   By: Vanetta Chou M.D.   On: 08/08/2023 15:54    Procedures Procedures    Medications Ordered in ED Medications  amoxicillin -clavulanate (AUGMENTIN ) 875-125 MG per tablet 1 tablet (has no administration in time range)  HYDROmorphone  (DILAUDID ) injection 1 mg (1 mg Intravenous Given 08/08/23 1507)  sodium chloride  0.9 %  bolus 1,000 mL (0 mLs Intravenous Stopped 08/08/23 1629)  ondansetron  (ZOFRAN ) injection 4 mg (4 mg Intravenous Given 08/08/23 1507)  iohexol  (OMNIPAQUE ) 350 MG/ML injection 75 mL (75 mLs Intravenous Contrast Given 08/08/23 1527)  ketorolac  (TORADOL ) 15 MG/ML injection 15 mg (15 mg Intravenous Given 08/08/23 1642)    ED Course/ Medical Decision Making/ A&P                                 Medical Decision Making Amount and/or Complexity of Data Reviewed Labs: ordered. Radiology: ordered.  Risk Prescription drug management.   Initial Impression and Ddx 63 year old well-appearing female presenting for abdominal pain.  Exam notable for tenderness about the left abdomen.  DDx includes diverticulitis, pyelonephritis, kidney stone, UTI, ovarian torsion, other. Patient PMH that increases complexity of ED encounter:  hypertension and GERD  Interpretation of Diagnostics - I independent reviewed and interpreted the  labs as followed: Leukocytosis  - I independently visualized the following imaging with scope of interpretation limited to determining acute life threatening conditions related to emergency care: CT ab/pelvis, which revealed acute sigmoid diverticulitis  Patient Reassessment and Ultimate Disposition/Management Workup suggestive of uncomplicated diverticulitis which is likely the etiology of her symptoms.  Overall she looks well nontoxic and workup does not suggest sepsis.  On reassessment, pain was significantly improved and vitals remained normal and she was in no acute distress.  P.o. challenge with no issue.  Started her on a 7-day course of Augmentin .  Advised her to follow-up with her PCP or GI doctor.  Discussed return precautions.  Discharged in good condition.  Patient management required discussion with the following services or consulting groups:  None  Complexity of Problems Addressed Acute complicated illness or Injury  Additional Data Reviewed and Analyzed Further history obtained from: Past medical history and medications listed in the EMR and Prior ED visit notes  Patient Encounter Risk Assessment Prescriptions         Final Clinical Impression(s) / ED Diagnoses Final diagnoses:  Diverticulitis    Rx / DC Orders ED Discharge Orders          Ordered    amoxicillin -clavulanate (AUGMENTIN ) 875-125 MG tablet  Every 12 hours        08/08/23 1647    HYDROcodone -acetaminophen  (NORCO/VICODIN) 5-325 MG tablet  Every 6 hours PRN        08/08/23 1648              Lang Norleen POUR, PA-C 08/08/23 1649    Ruthe Cornet, DO 08/11/23 1449

## 2023-08-08 NOTE — Discharge Instructions (Addendum)
 Evaluation today revealed that you have diverticulitis which is likely causing her symptoms.  I am starting on Augmentin .  Please take the entire course and follow-up with your PCP or GI doctor.  If you develop a fever, inability to tolerate fluid intake, persistent nausea vomiting diarrhea, bloody stools or any other concerning symptom please return to the emergency department for evaluation.

## 2023-08-08 NOTE — ED Triage Notes (Signed)
 C/o sharp pain in left groin that rads into vagina. Endorse n/v this morning. Denies fevers.

## 2023-08-22 ENCOUNTER — Emergency Department (HOSPITAL_BASED_OUTPATIENT_CLINIC_OR_DEPARTMENT_OTHER): Payer: BC Managed Care – PPO

## 2023-08-22 ENCOUNTER — Emergency Department (HOSPITAL_BASED_OUTPATIENT_CLINIC_OR_DEPARTMENT_OTHER)
Admission: EM | Admit: 2023-08-22 | Discharge: 2023-08-22 | Disposition: A | Discharge status: Home / Self Care | Payer: BC Managed Care – PPO | Attending: Emergency Medicine | Admitting: Emergency Medicine

## 2023-08-22 ENCOUNTER — Encounter (HOSPITAL_BASED_OUTPATIENT_CLINIC_OR_DEPARTMENT_OTHER): Payer: Self-pay

## 2023-08-22 ENCOUNTER — Other Ambulatory Visit: Payer: Self-pay

## 2023-08-22 DIAGNOSIS — R1031 Right lower quadrant pain: Secondary | ICD-10-CM | POA: Insufficient documentation

## 2023-08-22 DIAGNOSIS — Z7982 Long term (current) use of aspirin: Secondary | ICD-10-CM | POA: Insufficient documentation

## 2023-08-22 LAB — CBC WITH DIFFERENTIAL/PLATELET
Abs Immature Granulocytes: 0.02 10*3/uL (ref 0.00–0.07)
Basophils Absolute: 0.1 10*3/uL (ref 0.0–0.1)
Basophils Relative: 1 %
Eosinophils Absolute: 0.3 10*3/uL (ref 0.0–0.5)
Eosinophils Relative: 4 %
HCT: 42.2 % (ref 36.0–46.0)
Hemoglobin: 13.8 g/dL (ref 12.0–15.0)
Immature Granulocytes: 0 %
Lymphocytes Relative: 33 %
Lymphs Abs: 2.6 10*3/uL (ref 0.7–4.0)
MCH: 27.9 pg (ref 26.0–34.0)
MCHC: 32.7 g/dL (ref 30.0–36.0)
MCV: 85.4 fL (ref 80.0–100.0)
Monocytes Absolute: 0.8 10*3/uL (ref 0.1–1.0)
Monocytes Relative: 10 %
Neutro Abs: 4.3 10*3/uL (ref 1.7–7.7)
Neutrophils Relative %: 52 %
Platelets: 369 10*3/uL (ref 150–400)
RBC: 4.94 MIL/uL (ref 3.87–5.11)
RDW: 13.6 % (ref 11.5–15.5)
WBC: 8.1 10*3/uL (ref 4.0–10.5)
nRBC: 0 % (ref 0.0–0.2)

## 2023-08-22 LAB — URINALYSIS, ROUTINE W REFLEX MICROSCOPIC
Bilirubin Urine: NEGATIVE
Glucose, UA: NEGATIVE mg/dL
Hgb urine dipstick: NEGATIVE
Ketones, ur: NEGATIVE mg/dL
Leukocytes,Ua: NEGATIVE
Nitrite: NEGATIVE
Protein, ur: NEGATIVE mg/dL
Specific Gravity, Urine: <1.005 — ABNORMAL LOW (ref 1.005–1.030)
pH: 7 (ref 5.0–8.0)

## 2023-08-22 LAB — BASIC METABOLIC PANEL
Anion gap: 10 (ref 5–15)
BUN: 9 mg/dL (ref 8–23)
CO2: 27 mmol/L (ref 22–32)
Calcium: 9.8 mg/dL (ref 8.9–10.3)
Chloride: 102 mmol/L (ref 98–111)
Creatinine, Ser: 0.68 mg/dL (ref 0.44–1.00)
GFR, Estimated: >60 mL/min (ref >60)
Glucose, Bld: 96 mg/dL (ref 70–99)
Potassium: 3.6 mmol/L (ref 3.5–5.1)
Sodium: 139 mmol/L (ref 135–145)

## 2023-08-22 MED ORDER — IOHEXOL 300 MG/ML  SOLN
100.0000 mL | Freq: Once | INTRAMUSCULAR | Status: AC | PRN
  Administered 2023-08-22: 100 mL via INTRAVENOUS

## 2023-08-22 MED ORDER — HYOSCYAMINE SULFATE 0.125 MG SL SUBL: 0.1250 mg | SUBLINGUAL_TABLET | SUBLINGUAL | 0 refills | Status: AC | PRN

## 2023-08-22 NOTE — ED Provider Notes (Signed)
 [place] EMERGENCY DEPARTMENT AT [facility] Provider Note   CSN: [id number] Arrival date & time: 08/22/23  8756     History  No chief complaint on file.   Teresa Moreno is a 63 y.o. female.  Presents to the department for evaluation of abdominal discomfort.  Patient reports that she was recently treated for diverticulitis.  Since then she has not been able to get back onto solid foods.  She has bloating, nausea and cramping associated with eating.  Tonight she had sharp pains in the right lower abdomen.       Home Medications Prior to Admission medications   Medication Sig Start Date End Date Taking? Authorizing Provider  amLODipine (NORVASC) 5 MG tablet  02/23/17   [provider]  aspirin EC 81 MG tablet Take 81 mg by mouth.    [provider]  celecoxib (CELEBREX) 200 MG capsule TAKE 1 CAPSULE BY MOUTH DAILY 07/16/20   Judi Saa, DO  Flaxseed, Linseed, (FLAX SEED OIL) 1000 MG CAPS Take by mouth.    [provider]  HYDROcodone-acetaminophen (NORCO/VICODIN) 5-325 MG tablet Take 1 tablet by mouth every 6 (six) hours as needed. 08/08/23   Gareth Eagle, PA-C  methocarbamol (ROBAXIN) 500 MG tablet Take 1 tablet (500 mg total) by mouth 2 (two) times daily. 11/14/21   Marita Kansas, PA-C  metoprolol tartrate (LOPRESSOR) 100 MG tablet Take 1 tablet (100mg ) 2 hours before your cardiac CT scan. 01/31/20   Debbe Odea, MD  mometasone (ELOCON) 0.1 % cream APPLY SPARINGLY AND RUB IN WELL TO AFFECTED AREAS TWICE A DAY 02/05/14   [provider]  Multiple Vitamin (MULTI-VITAMINS) TABS Take by mouth.    [provider]  Multiple Vitamins-Minerals (MULTIVITAMIN ADULT PO) Take by mouth.    [provider]  norethindrone (MICRONOR,CAMILA,ERRIN) 0.35 MG tablet  03/31/17   [provider]  Omega-3 1000 MG CAPS Take by mouth.    [provider]  omeprazole (PRILOSEC) 40 MG capsule Take 40 mg daily by mouth.     [provider]  ondansetron (ZOFRAN-ODT) 4 MG disintegrating tablet Take 1 tablet (4 mg total) by mouth every 8 (eight) hours as needed. 01/26/23   Marita Kansas, PA-C  pantoprazole (PROTONIX) 40 MG tablet  04/12/17   [provider]      Allergies    Codeine    Review of Systems   Review of Systems  Physical Exam Updated Vital Signs BP (!) 158/79   Pulse 72   Temp 98.1 F (36.7 C) (Oral)   Resp 16   Ht 5\' 7"  (1.702 m)   Wt 115.7 kg   LMP 01/14/2015   SpO2 100%   BMI 39.94 kg/m  Physical Exam Vitals and nursing note reviewed.  Constitutional:      General: She is not in acute distress.    Appearance: She is well-developed.  HENT:     Head: Normocephalic and atraumatic.     Mouth/Throat:     Mouth: Mucous membranes are moist.  Eyes:     General: Vision grossly intact. Gaze aligned appropriately.     Extraocular Movements: Extraocular movements intact.     Conjunctiva/sclera: Conjunctivae normal.  Cardiovascular:     Rate and Rhythm: Normal rate and regular rhythm.     Pulses: Normal pulses.     Heart sounds: Normal heart sounds, S1 normal and S2 normal. No murmur heard.    No friction rub. No gallop.  Pulmonary:  Effort: Pulmonary effort is normal. No respiratory distress.     Breath sounds: Normal breath sounds.  Abdominal:     General: Bowel sounds are normal.     Palpations: Abdomen is soft.     Tenderness: There is no abdominal tenderness. There is no guarding or rebound.     Hernia: No hernia is present.  Musculoskeletal:        General: No swelling.     Cervical back: Full passive range of motion without pain, normal range of motion and neck supple. No spinous process tenderness or muscular tenderness. Normal range of motion.     Right lower leg: No edema.     Left lower leg: No edema.  Skin:    General: Skin is warm and dry.     Capillary Refill: Capillary refill takes less than 2 seconds.     Findings: No ecchymosis, erythema, rash or  wound.  Neurological:     General: No focal deficit present.     Mental Status: She is alert and oriented to person, place, and time.     GCS: GCS eye subscore is 4. GCS verbal subscore is 5. GCS motor subscore is 6.     Cranial Nerves: Cranial nerves 2-12 are intact.     Sensory: Sensation is intact.     Motor: Motor function is intact.     Coordination: Coordination is intact.  Psychiatric:        Attention and Perception: Attention normal.        Mood and Affect: Mood normal.        Speech: Speech normal.        Behavior: Behavior normal.     ED Results / Procedures / Treatments   Labs (all labs ordered are listed, but only abnormal results are displayed) Labs Reviewed  URINALYSIS, ROUTINE W REFLEX MICROSCOPIC - Abnormal; Notable for the following components:      Result Value   Color, Urine COLORLESS (*)    Specific Gravity, Urine <1.005 (*)    All other components within normal limits  CBC WITH DIFFERENTIAL/PLATELET  BASIC METABOLIC PANEL    EKG None  Radiology CT ABDOMEN PELVIS W CONTRAST Result Date: 08/22/2023 CLINICAL DATA:  Abdominal pain.  History of diverticulitis. EXAM: CT ABDOMEN AND PELVIS WITH CONTRAST TECHNIQUE: Multidetector CT imaging of the abdomen and pelvis was performed using the standard protocol following bolus administration of intravenous contrast. RADIATION DOSE REDUCTION: This exam was performed according to the departmental dose-optimization program which includes automated exposure control, adjustment of the mA and/or kV according to patient size and/or use of iterative reconstruction technique. CONTRAST:  OMNIPAQUE IOHEXOL 300 MG/ML  SOLN COMPARISON:  08/08/2023 FINDINGS: Lower chest: No acute findings. Hepatobiliary: No suspicious focal abnormality within the liver parenchyma. Cholecystectomy. No intrahepatic or extrahepatic biliary dilation. Pancreas: No focal mass lesion. No dilatation of the main duct. No intraparenchymal cyst. No  peripancreatic edema. Spleen: No splenomegaly. No suspicious focal mass lesion. Adrenals/Urinary Tract: No adrenal nodule or mass. Right kidney unremarkable. 3.0 cm simple cyst lower pole left kidney. No followup imaging is recommended. Tiny well-defined homogeneous low-density lesions in the left kidney are too small to characterize but are statistically most likely benign and probably cysts. No followup imaging is recommended. No evidence for hydroureter. The urinary bladder appears normal for the degree of distention. Stomach/Bowel: Stomach is unremarkable. No gastric wall thickening. No evidence of outlet obstruction. Duodenum is normally positioned as is the ligament of Treitz. No small  bowel wall thickening. No small bowel dilatation. The terminal ileum is normal. The appendix is not well visualized, but there is no edema or inflammation in the region of the cecal tip to suggest appendicitis. The subtle pericolonic edema/inflammation seen along the sigmoid colon previously has resolved in the interval. No evidence for abscess. Vascular/Lymphatic: No abdominal aortic aneurysm. No abdominal aortic atherosclerotic calcification. There is no gastrohepatic or hepatoduodenal ligament lymphadenopathy. No retroperitoneal or mesenteric lymphadenopathy. No pelvic sidewall lymphadenopathy. Reproductive: There is no adnexal mass. Other: No intraperitoneal free fluid. Musculoskeletal: No worrisome lytic or sclerotic osseous abnormality. IMPRESSION: 1. No acute findings in the abdomen or pelvis. 2. The subtle pericolonic edema/inflammation seen along the sigmoid colon previously has resolved in the interval. No evidence for abscess. Electronically Signed   By: Kennith Center M.D.   On: 08/22/2023 05:08    Procedures Procedures    Medications Ordered in ED Medications  iohexol (OMNIPAQUE) 300 MG/ML solution 100 mL (100 mLs Intravenous Contrast Given 08/22/23 0444)    ED Course/ Medical Decision Making/ A&P                                  Medical Decision Making Amount and/or Complexity of Data Reviewed Labs: ordered. Radiology: ordered.  Risk Prescription drug management.   Presents with nonspecific abdominal pain and cramping.  She was recently treated for diverticulitis.  Abdominal exam does not reveal any peritonitis.  Lab work is normal.  Patient was sent for CT scan to rule out appendicitis and complications of recent diverticulitis.  CT scan is normal, including resolution of the mild diverticulitis that was seen previously.  Patient reassured, no acute emergent process identified.  Will try some symptomatic treatment while she is awaiting follow-up with GI.        Final Clinical Impression(s) / ED Diagnoses Final diagnoses:  Right lower quadrant abdominal pain    Rx / DC Orders ED Discharge Orders     None         Tamotsu Wiederholt, Canary Brim, MD 08/22/23 [phone]

## 2023-08-22 NOTE — ED Triage Notes (Addendum)
 Pt reports being diagnosed with diverticulitis here 2 weeks ago. Started getting nauseous tonight, feeling bloated, and lower right abdominal pain. Pt states pain is not as bad as last time but is here because she wants to make sure it doesn't get any worse. Pt denies vomiting/diarrhea/fever.

## 2023-09-19 ENCOUNTER — Other Ambulatory Visit: Payer: Self-pay

## 2023-09-19 ENCOUNTER — Emergency Department (HOSPITAL_BASED_OUTPATIENT_CLINIC_OR_DEPARTMENT_OTHER)
Admission: EM | Admit: 2023-09-19 | Discharge: 2023-09-20 | Disposition: A | Discharge status: Home / Self Care | Attending: Emergency Medicine | Admitting: Emergency Medicine

## 2023-09-19 ENCOUNTER — Encounter (HOSPITAL_BASED_OUTPATIENT_CLINIC_OR_DEPARTMENT_OTHER): Payer: Self-pay

## 2023-09-19 ENCOUNTER — Emergency Department (HOSPITAL_BASED_OUTPATIENT_CLINIC_OR_DEPARTMENT_OTHER)

## 2023-09-19 DIAGNOSIS — Z79899 Other long term (current) drug therapy: Secondary | ICD-10-CM | POA: Diagnosis not present

## 2023-09-19 DIAGNOSIS — K5730 Diverticulosis of large intestine without perforation or abscess without bleeding: Secondary | ICD-10-CM | POA: Diagnosis not present

## 2023-09-19 DIAGNOSIS — K5790 Diverticulosis of intestine, part unspecified, without perforation or abscess without bleeding: Secondary | ICD-10-CM

## 2023-09-19 DIAGNOSIS — Z7982 Long term (current) use of aspirin: Secondary | ICD-10-CM | POA: Insufficient documentation

## 2023-09-19 DIAGNOSIS — R1013 Epigastric pain: Secondary | ICD-10-CM | POA: Diagnosis present

## 2023-09-19 DIAGNOSIS — I10 Essential (primary) hypertension: Secondary | ICD-10-CM | POA: Insufficient documentation

## 2023-09-19 LAB — COMPREHENSIVE METABOLIC PANEL
ALT: 17 U/L (ref 0–44)
AST: 15 U/L (ref 15–41)
Albumin: 4.4 g/dL (ref 3.5–5.0)
Alkaline Phosphatase: 57 U/L (ref 38–126)
Anion gap: 10 (ref 5–15)
BUN: 11 mg/dL (ref 8–23)
CO2: 27 mmol/L (ref 22–32)
Calcium: 9 mg/dL (ref 8.9–10.3)
Chloride: 102 mmol/L (ref 98–111)
Creatinine, Ser: 0.79 mg/dL (ref 0.44–1.00)
GFR, Estimated: >60 mL/min (ref >60)
Glucose, Bld: 90 mg/dL (ref 70–99)
Potassium: 3.7 mmol/L (ref 3.5–5.1)
Sodium: 139 mmol/L (ref 135–145)
Total Bilirubin: 0.9 mg/dL (ref 0.0–1.2)
Total Protein: 7.4 g/dL (ref 6.5–8.1)

## 2023-09-19 LAB — CBC WITH DIFFERENTIAL/PLATELET
Abs Immature Granulocytes: 0.02 10*3/uL (ref 0.00–0.07)
Basophils Absolute: 0.1 10*3/uL (ref 0.0–0.1)
Basophils Relative: 1 %
Eosinophils Absolute: 0.2 10*3/uL (ref 0.0–0.5)
Eosinophils Relative: 2 %
HCT: 41.8 % (ref 36.0–46.0)
Hemoglobin: 13.3 g/dL (ref 12.0–15.0)
Immature Granulocytes: 0 %
Lymphocytes Relative: 35 %
Lymphs Abs: 3 10*3/uL (ref 0.7–4.0)
MCH: 27.4 pg (ref 26.0–34.0)
MCHC: 31.8 g/dL (ref 30.0–36.0)
MCV: 86 fL (ref 80.0–100.0)
Monocytes Absolute: 0.9 10*3/uL (ref 0.1–1.0)
Monocytes Relative: 10 %
Neutro Abs: 4.4 10*3/uL (ref 1.7–7.7)
Neutrophils Relative %: 52 %
Platelets: 364 10*3/uL (ref 150–400)
RBC: 4.86 MIL/uL (ref 3.87–5.11)
RDW: 13.8 % (ref 11.5–15.5)
WBC: 8.5 10*3/uL (ref 4.0–10.5)
nRBC: 0 % (ref 0.0–0.2)

## 2023-09-19 LAB — URINALYSIS, ROUTINE W REFLEX MICROSCOPIC
Bacteria, UA: NONE SEEN
Bilirubin Urine: NEGATIVE
Glucose, UA: NEGATIVE mg/dL
Hgb urine dipstick: NEGATIVE
Ketones, ur: NEGATIVE mg/dL
Leukocytes,Ua: NEGATIVE
Nitrite: NEGATIVE
Protein, ur: NEGATIVE mg/dL
Specific Gravity, Urine: <1.005 — ABNORMAL LOW (ref 1.005–1.030)
pH: 6 (ref 5.0–8.0)

## 2023-09-19 LAB — LIPASE, BLOOD: Lipase: 28 U/L (ref 11–51)

## 2023-09-19 MED ORDER — ALUM & MAG HYDROXIDE-SIMETH 200-200-20 MG/5ML PO SUSP
30.0000 mL | Freq: Once | ORAL | Status: AC
  Administered 2023-09-19: 30 mL via ORAL
  Filled 2023-09-19: qty 30

## 2023-09-19 MED ORDER — FAMOTIDINE IN NACL 20-0.9 MG/50ML-% IV SOLN
20.0000 mg | Freq: Once | INTRAVENOUS | Status: AC
  Administered 2023-09-19: 20 mg via INTRAVENOUS
  Filled 2023-09-19: qty 50

## 2023-09-19 MED ORDER — IOHEXOL 300 MG/ML  SOLN
100.0000 mL | Freq: Once | INTRAMUSCULAR | Status: AC | PRN
  Administered 2023-09-19: 100 mL via INTRAVENOUS

## 2023-09-19 MED ORDER — SODIUM CHLORIDE 0.9 % IV BOLUS
1000.0000 mL | Freq: Once | INTRAVENOUS | Status: AC
  Administered 2023-09-19: 1000 mL via INTRAVENOUS

## 2023-09-19 MED ORDER — ONDANSETRON HCL 4 MG/2ML IJ SOLN
4.0000 mg | Freq: Once | INTRAMUSCULAR | Status: AC
  Administered 2023-09-19: 4 mg via INTRAVENOUS
  Filled 2023-09-19: qty 2

## 2023-09-19 NOTE — ED Provider Notes (Signed)
 [place] EMERGENCY DEPARTMENT AT [facility] Provider Note  CSN: [id number] Arrival date & time: 09/19/23 2031  Chief Complaint(s) Abdominal Pain  HPI Teresa Moreno is a 63 y.o. female with past medical history as below, significant for gerd, htn, obesity, gastritis, hld who presents to the ED with complaint of epigastric pain, lightheaded, nausea  Symptoms have been intermittent over the past 2 months.  She is having epigastric cramping, bloating, fullness sensation over the past 2 to 3 days.  Has some cramping after eating.  No BRBPR or melena, no change in bowel or bladder function last few days.  No vomiting but is having some nausea.  She saw gastroenterology earlier this week and is pending endoscopy colonoscopy next month.  No evidence of bleeding.  No daily NSAID use, no daily alcohol use.  She was diagnosed diverticulitis last month, completed antibiotics which did improve her symptoms.  She had some residual discomfort and was given an additional week of Augmentin which did yield further improvement.  She is continued on PPI daily.  Past Medical History Past Medical History:  Diagnosis Date   basal cell    GERD (gastroesophageal reflux disease)    Hypertension    Patient Active Problem List   Diagnosis Date Noted   Right ankle pain 07/09/2020   Lumbar radiculopathy, right 05/01/2020   Achilles tendinitis of right lower extremity 02/07/2018   Health care maintenance 04/24/2015   Essential hypertension 12/04/2013   Pure hypercholesterolemia 12/04/2013   Severe obesity (BMI 35.0-35.9 with comorbidity) (HCC) 12/04/2013   Home Medication(s) Prior to Admission medications   Medication Sig Start Date End Date Taking? Authorizing Provider  pantoprazole (PROTONIX) 40 MG tablet Take 1 tablet (40 mg total) by mouth daily for 14 days. 09/20/23 10/04/23 Yes Tanda Rockers A, DO  sucralfate (CARAFATE) 1 g tablet Take 1 tablet (1 g total) by mouth with breakfast, with lunch, and  with evening meal for 7 days. 09/20/23 09/27/23 Yes Sloan Leiter, DO  amLODipine (NORVASC) 5 MG tablet  02/23/17   [provider]  aspirin EC 81 MG tablet Take 81 mg by mouth.    [provider]  celecoxib (CELEBREX) 200 MG capsule TAKE 1 CAPSULE BY MOUTH DAILY 07/16/20   Judi Saa, DO  Flaxseed, Linseed, (FLAX SEED OIL) 1000 MG CAPS Take by mouth.    [provider]  HYDROcodone-acetaminophen (NORCO/VICODIN) 5-325 MG tablet Take 1 tablet by mouth every 6 (six) hours as needed. 08/08/23   Gareth Eagle, PA-C  hyoscyamine (LEVSIN/SL) 0.125 MG SL tablet Place 1 tablet (0.125 mg total) under the tongue every 4 (four) hours as needed. 08/22/23   Gilda Crease, MD  methocarbamol (ROBAXIN) 500 MG tablet Take 1 tablet (500 mg total) by mouth 2 (two) times daily. 11/14/21   Marita Kansas, PA-C  metoprolol tartrate (LOPRESSOR) 100 MG tablet Take 1 tablet (100mg ) 2 hours before your cardiac CT scan. 01/31/20   Debbe Odea, MD  mometasone (ELOCON) 0.1 % cream APPLY SPARINGLY AND RUB IN WELL TO AFFECTED AREAS TWICE A DAY 02/05/14   [provider]  Multiple Vitamin (MULTI-VITAMINS) TABS Take by mouth.    [provider]  Multiple Vitamins-Minerals (MULTIVITAMIN ADULT PO) Take by mouth.    [provider]  norethindrone (MICRONOR,CAMILA,ERRIN) 0.35 MG tablet  03/31/17   [provider]  Omega-3 1000 MG CAPS Take by mouth.    [provider]  omeprazole (PRILOSEC) 40 MG capsule Take 40 mg daily  by mouth.    [provider]  ondansetron (ZOFRAN-ODT) 4 MG disintegrating tablet Take 1 tablet (4 mg total) by mouth every 8 (eight) hours as needed. 01/26/23   Marita Kansas, PA-C  pantoprazole (PROTONIX) 40 MG tablet  04/12/17   [provider]                                                                                                                                    Past Surgical History Past Surgical History:   Procedure Laterality Date   CHOLECYSTECTOMY     FRACTURE SURGERY     Family History Family History  Problem Relation Age of Onset   Breast cancer Maternal Aunt 52    Social History Social History   Tobacco Use   Smoking status: Never   Smokeless tobacco: Never  Vaping Use   Vaping status: Never Used  Substance Use Topics   Alcohol use: Yes    Alcohol/week: 6.0 standard drinks of alcohol    Types: 4 Shots of liquor, 2 Glasses of wine per week   Drug use: No   Allergies Codeine  Review of Systems A thorough review of systems was obtained and all systems are negative except as noted in the HPI and PMH.   Physical Exam Vital Signs  I have reviewed the triage vital signs BP 125/81 (BP Location: Right Arm)   Pulse 69   Temp 97.8 F (36.6 C) (Oral)   Resp 20   Ht 5\' 7"  (1.702 m)   Wt 111.6 kg   LMP 01/14/2015   SpO2 97%   BMI 38.53 kg/m  Physical Exam Vitals and nursing note reviewed.  Constitutional:      General: She is not in acute distress.    Appearance: Normal appearance. She is well-developed. She is obese.  HENT:     Head: Normocephalic and atraumatic.     Right Ear: External ear normal.     Left Ear: External ear normal.     Nose: Nose normal.     Mouth/Throat:     Mouth: Mucous membranes are moist.  Eyes:     General: No scleral icterus.       Right eye: No discharge.        Left eye: No discharge.  Cardiovascular:     Rate and Rhythm: Normal rate and regular rhythm.     Pulses: Normal pulses.     Heart sounds: Normal heart sounds.  Pulmonary:     Effort: Pulmonary effort is normal. No respiratory distress.     Breath sounds: Normal breath sounds. No stridor.  Abdominal:     General: Abdomen is flat. There is no distension.     Palpations: Abdomen is soft.     Tenderness: There is abdominal tenderness in the epigastric area.  Musculoskeletal:     Cervical back: No rigidity.     Right lower leg: No edema.  Left lower leg: No edema.   Skin:    General: Skin is warm and dry.     Capillary Refill: Capillary refill takes less than 2 seconds.  Neurological:     Mental Status: She is alert.  Psychiatric:        Mood and Affect: Mood normal.        Behavior: Behavior normal. Behavior is cooperative.     ED Results and Treatments Labs (all labs ordered are listed, but only abnormal results are displayed) Labs Reviewed  URINALYSIS, ROUTINE W REFLEX MICROSCOPIC - Abnormal; Notable for the following components:      Result Value   Color, Urine COLORLESS (*)    Specific Gravity, Urine <1.005 (*)    All other components within normal limits  RESP PANEL BY RT-PCR (RSV, FLU A&B, COVID)  RVPGX2  LIPASE, BLOOD  COMPREHENSIVE METABOLIC PANEL  CBC WITH DIFFERENTIAL/PLATELET  TROPONIN I (HIGH SENSITIVITY)                                                                                                                          Radiology CT ABDOMEN PELVIS W CONTRAST Result Date: 09/20/2023 CLINICAL DATA:  Acute nonlocalized abdominal pain. EXAM: CT ABDOMEN AND PELVIS WITH CONTRAST TECHNIQUE: Multidetector CT imaging of the abdomen and pelvis was performed using the standard protocol following bolus administration of intravenous contrast. RADIATION DOSE REDUCTION: This exam was performed according to the departmental dose-optimization program which includes automated exposure control, adjustment of the mA and/or kV according to patient size and/or use of iterative reconstruction technique. CONTRAST:  OMNIPAQUE IOHEXOL 300 MG/ML  SOLN COMPARISON:  08/22/2023 FINDINGS: Lower chest: Mild dependent atelectasis in the lung bases. Hepatobiliary: No focal liver abnormality is seen. Status post cholecystectomy. No biliary dilatation. Pancreas: Unremarkable. No pancreatic ductal dilatation or surrounding inflammatory changes. Spleen: Normal in size without focal abnormality. Adrenals/Urinary Tract: Adrenal glands are unremarkable. Kidneys are  normal, without renal calculi, focal lesion, or hydronephrosis. Bladder is unremarkable. Stomach/Bowel: Stomach, small bowel, and colon are not abnormally distended. No wall thickening or inflammatory changes. Sigmoid colonic diverticulosis without evidence of acute diverticulitis. Appendix is not identified. Vascular/Lymphatic: Aortic atherosclerosis. No enlarged abdominal or pelvic lymph nodes. Reproductive: Uterus is not enlarged. Exophytic calcification likely representing a fibroid. No abnormal adnexal masses. Other: No abdominal wall hernia or abnormality. No abdominopelvic ascites. Musculoskeletal: No acute or significant osseous findings. IMPRESSION: 1. No acute process demonstrated in the abdomen or pelvis. No evidence of bowel obstruction or inflammation. Colonic diverticulosis without evidence of acute diverticulitis. 2. Mild aortic atherosclerosis. 3. Calcified uterine fibroid. Electronically Signed   By: Burman Nieves M.D.   On: 09/20/2023 00:04    Pertinent labs & imaging results that were available during my care of the patient were reviewed by me and considered in my medical decision making (see MDM for details).  Medications Ordered in ED Medications  ondansetron (ZOFRAN) injection 4 mg (4 mg Intravenous Given 09/19/23 2242)  famotidine (  PEPCID) IVPB 20 mg premix (0 mg Intravenous Stopped 09/20/23 0030)  sodium chloride 0.9 % bolus 1,000 mL (0 mLs Intravenous Stopped 09/20/23 0054)  alum & mag hydroxide-simeth (MAALOX/MYLANTA) 200-200-20 MG/5ML suspension 30 mL (30 mLs Oral Given 09/19/23 2351)  iohexol (OMNIPAQUE) 300 MG/ML solution 100 mL (100 mLs Intravenous Contrast Given 09/19/23 2342)                                                                                                                                     Procedures Procedures  (including critical care time)  Medical Decision Making / ED Course    Medical Decision Making:    NADEGE CARRIGER is a 63 y.o. female  with past medical history as below, significant for gerd, htn, obesity, gastritis, hld who presents to the ED with complaint of epigastric pain, lightheaded, nausea. The complaint involves an extensive differential diagnosis and also carries with it a high risk of complications and morbidity.  Serious etiology was considered. Ddx includes but is not limited to: Differential diagnosis includes but is not exclusive to acute cholecystitis, intrathoracic causes for epigastric abdominal pain, gastritis, duodenitis, pancreatitis, small bowel or large bowel obstruction, abdominal aortic aneurysm, hernia, gastritis, etc.   Complete initial physical exam performed, notably the patient was in no distress, sitting comfortably in stretcher, abdomen nonperitoneal.    Reviewed and confirmed nursing documentation for past medical history, family history, social history.  Vital signs reviewed.      Clinical Course as of 09/20/23 0216  Mon Sep 20, 2023  0050 CTAP stable [SG]  0113 Feeling better [SG]    Clinical Course User Index [SG] Sloan Leiter, DO    Brief summary: 63 year old female history as above here with recurrent epigastric pain.  Intermittent pain over the last 2 months.  This most recent episode began 2 or 3 days ago after seeing her gastroenterologist.  Nausea no vomiting.  No evidence of bleeding.  Her exam is reassuring, she has mild tenderness epigastrium, no rebound, nonperitoneal.  No jaundice.  HDS.   Labs are stable, imaging stable.  She is feeling much better, she is tolerant p.o. intake without difficulty.  HDS.  Symptoms today likely secondary to recurrent gastritis.  Will add Carafate, restart PPI.  Encouraged bland diet.  Courage follow-up with GI   The patient improved significantly and was discharged in stable condition. Detailed discussions were had with the patient/guardian regarding current findings, and need for close f/u with PCP or on call doctor. The patient/guardian  has been instructed to return immediately if the symptoms worsen in any way for re-evaluation. Patient/guardian verbalized understanding and is in agreement with current care plan. All questions answered prior to discharge.            Additional history obtained: -Additional history obtained from na -External records from outside source obtained and reviewed including: Chart review including previous notes, labs, imaging, consultation  notes including  Home medications, prior ED evaluations, prior gastroenterology documentation   Lab Tests: -I ordered, reviewed, and interpreted labs.   The pertinent results include:   Labs Reviewed  URINALYSIS, ROUTINE W REFLEX MICROSCOPIC - Abnormal; Notable for the following components:      Result Value   Color, Urine COLORLESS (*)    Specific Gravity, Urine <1.005 (*)    All other components within normal limits  RESP PANEL BY RT-PCR (RSV, FLU A&B, COVID)  RVPGX2  LIPASE, BLOOD  COMPREHENSIVE METABOLIC PANEL  CBC WITH DIFFERENTIAL/PLATELET  TROPONIN I (HIGH SENSITIVITY)    Notable for labs stable  EKG   EKG Interpretation Date/Time:    Ventricular Rate:    PR Interval:    QRS Duration:    QT Interval:    QTC Calculation:   R Axis:      Text Interpretation:           Imaging Studies ordered: I ordered imaging studies including ctap I independently visualized the following imaging with scope of interpretation limited to determining acute life threatening conditions related to emergency care; findings noted above I independently visualized and interpreted imaging. I agree with the radiologist interpretation   Medicines ordered and prescription drug management: Meds ordered this encounter  Medications   ondansetron (ZOFRAN) injection 4 mg   famotidine (PEPCID) IVPB 20 mg premix   sodium chloride 0.9 % bolus 1,000 mL   alum & mag hydroxide-simeth (MAALOX/MYLANTA) 200-200-20 MG/5ML suspension 30 mL   iohexol (OMNIPAQUE)  300 MG/ML solution 100 mL   sucralfate (CARAFATE) 1 g tablet    Sig: Take 1 tablet (1 g total) by mouth with breakfast, with lunch, and with evening meal for 7 days.    Dispense:  21 tablet    Refill:  0   pantoprazole (PROTONIX) 40 MG tablet    Sig: Take 1 tablet (40 mg total) by mouth daily for 14 days.    Dispense:  14 tablet    Refill:  0    -I have reviewed the patients home medicines and have made adjustments as needed   Consultations Obtained: na   Cardiac Monitoring: Continuous pulse oximetry interpreted by myself, 100% on RA.    Social Determinants of Health:  Diagnosis or treatment significantly limited by social determinants of health: obesity   Reevaluation: After the interventions noted above, I reevaluated the patient and found that they have improved  Co morbidities that complicate the patient evaluation  Past Medical History:  Diagnosis Date   basal cell    GERD (gastroesophageal reflux disease)    Hypertension       Dispostion: Disposition decision including need for hospitalization was considered, and patient discharged from emergency department.    Final Clinical Impression(s) / ED Diagnoses Final diagnoses:  Epigastric pain  Diverticulosis        Sloan Leiter, DO 09/20/23 0216

## 2023-09-19 NOTE — ED Triage Notes (Signed)
 Says she's had upper abdominal pain, subjective distention, nausea, and brain fog x 4 days beginning Thursday.  Says she's recently been seen and dx with diverticulitis.

## 2023-09-20 LAB — RESP PANEL BY RT-PCR (RSV, FLU A&B, COVID)  RVPGX2
Influenza A by PCR: NEGATIVE
Influenza B by PCR: NEGATIVE
Resp Syncytial Virus by PCR: NEGATIVE
SARS Coronavirus 2 by RT PCR: NEGATIVE

## 2023-09-20 LAB — TROPONIN I (HIGH SENSITIVITY): Troponin I (High Sensitivity): 2 ng/L (ref <18)

## 2023-09-20 MED ORDER — SUCRALFATE 1 G PO TABS: 1.0000 g | ORAL_TABLET | Freq: Three times a day (TID) | ORAL | 0 refills | Status: DC

## 2023-09-20 MED ORDER — PANTOPRAZOLE SODIUM 40 MG PO TBEC
40.0000 mg | DELAYED_RELEASE_TABLET | Freq: Every day | ORAL | 0 refills | Status: DC
Start: 2023-09-20 — End: 2023-10-28

## 2023-09-20 MED ORDER — ONDANSETRON HCL 4 MG PO TABS: 4.0000 mg | ORAL_TABLET | ORAL | 0 refills | Status: AC | PRN

## 2023-09-20 NOTE — Discharge Instructions (Addendum)
 It was a pleasure caring for you today in the emergency department.  Continue to follow a bland diet, no tobacco/alcohol/spicy or fried foods. Please follow up with you GI specialist and with your PCP.  Drink plenty of fluids, get plenty of rest over the next couple days.  Please return to the emergency department for any worsening or worrisome symptoms.        Please follow up with your primary care doctor within 2-3 days. For constipation we also recommend a diet high in fiber (beans, fruits, vegetables, whole grains). Take Colace 100-200 mg up to three times per day. You may take along with Senokot 1-2 tabs, ingest with full glass of water.  You may also take MiraLAX 1-2 capfuls 1-2 times a day until stools become soft and then slowly decrease the amount of MiraLAX used.  Maintain fluid intake 6-8 glasses per day. Please increase fibers in your diet. You may also take Milk of Magnesia 30 mL as needed for constipation, you may repeat in 2 hours again if no bowl movement.

## 2023-10-06 ENCOUNTER — Ambulatory Visit: Admitting: Sports Medicine

## 2023-10-07 ENCOUNTER — Ambulatory Visit: Payer: Self-pay

## 2023-10-07 DIAGNOSIS — K641 Second degree hemorrhoids: Secondary | ICD-10-CM | POA: Diagnosis not present

## 2023-10-07 DIAGNOSIS — K5730 Diverticulosis of large intestine without perforation or abscess without bleeding: Secondary | ICD-10-CM | POA: Diagnosis not present

## 2023-10-07 DIAGNOSIS — K635 Polyp of colon: Secondary | ICD-10-CM | POA: Diagnosis not present

## 2023-10-07 DIAGNOSIS — D123 Benign neoplasm of transverse colon: Secondary | ICD-10-CM | POA: Diagnosis present

## 2023-10-07 DIAGNOSIS — K2950 Unspecified chronic gastritis without bleeding: Secondary | ICD-10-CM | POA: Diagnosis not present

## 2023-10-07 DIAGNOSIS — K449 Diaphragmatic hernia without obstruction or gangrene: Secondary | ICD-10-CM | POA: Diagnosis not present

## 2023-10-09 IMAGING — MG MM DIGITAL SCREENING BILAT W/ TOMO AND CAD
6 of 10 series · 6 of 30 positions shown · non-contrast
Comparison: Previous exam(s).

ACR Breast Density Category a: The breast tissue is almost entirely
fatty.

CLINICAL DATA: Screening.

EXAM:
DIGITAL SCREENING BILATERAL MAMMOGRAM WITH TOMOSYNTHESIS AND CAD
TECHNIQUE: Bilateral screening digital craniocaudal and mediolateral oblique
mammograms were obtained. Bilateral screening digital breast
tomosynthesis was performed. The images were evaluated with
computer-aided detection.

[L CC synth-2D]
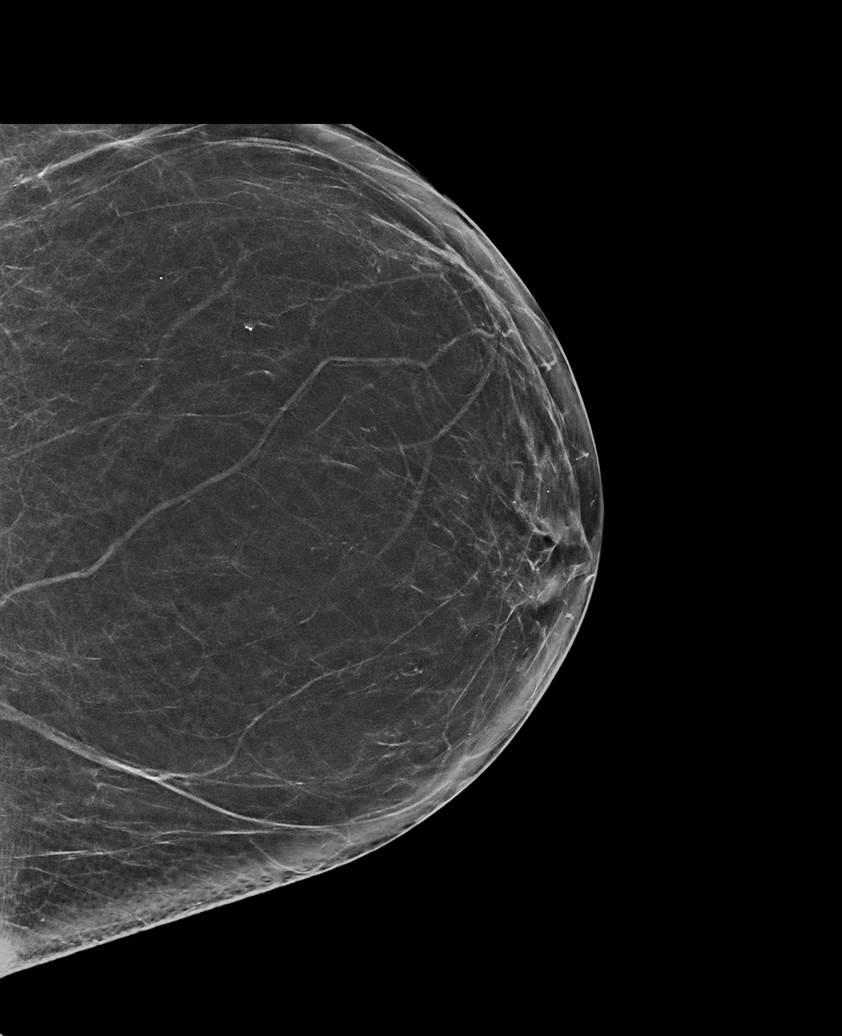

[R MLO synth-2D]
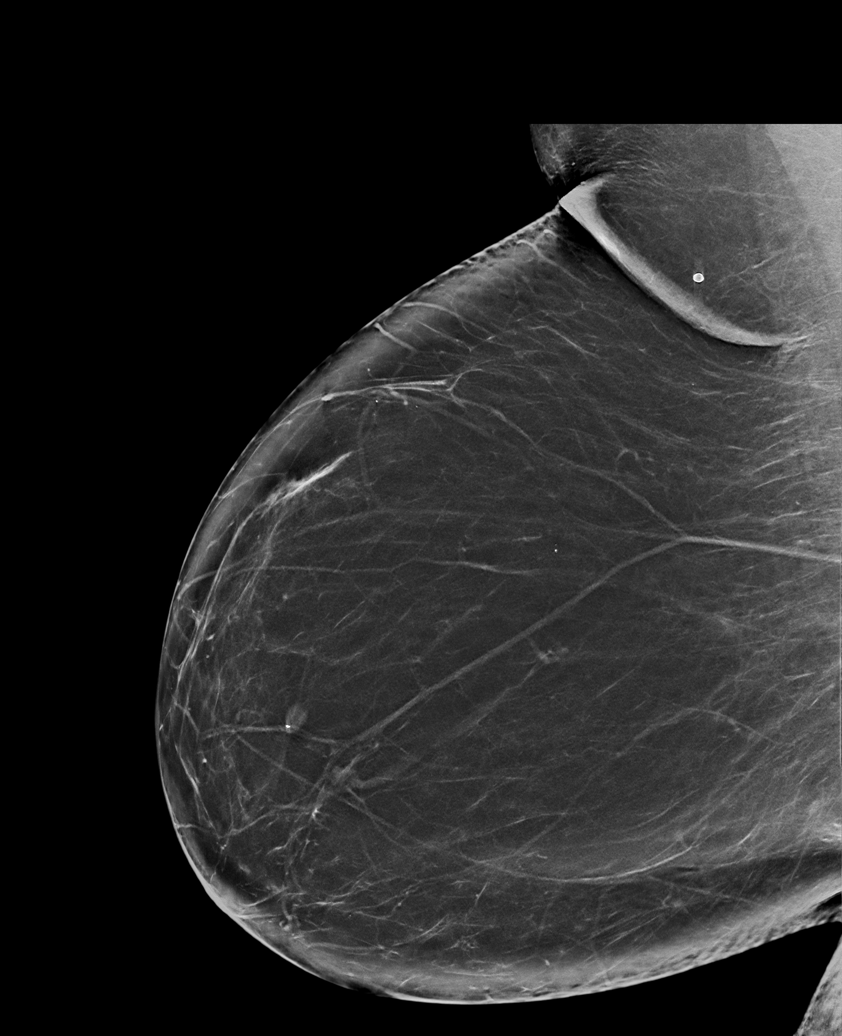

[L MLO synth-2D (1 of 2)]
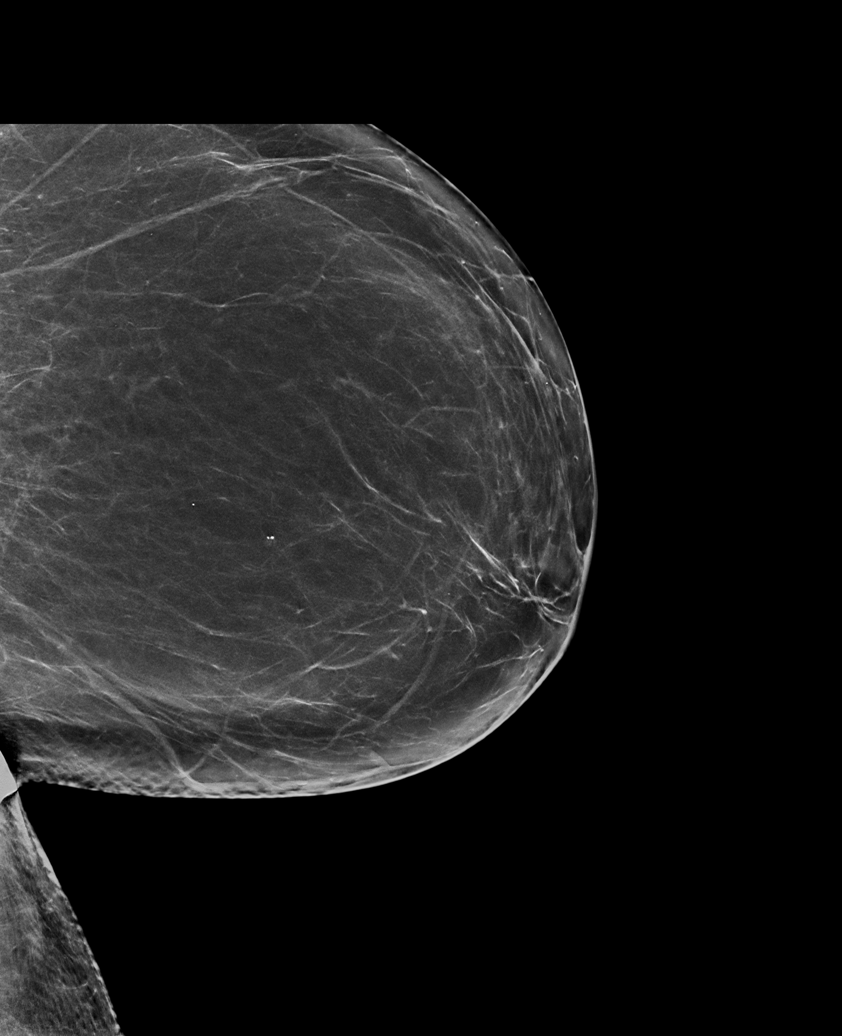

[L MLO synth-2D (2 of 2)]
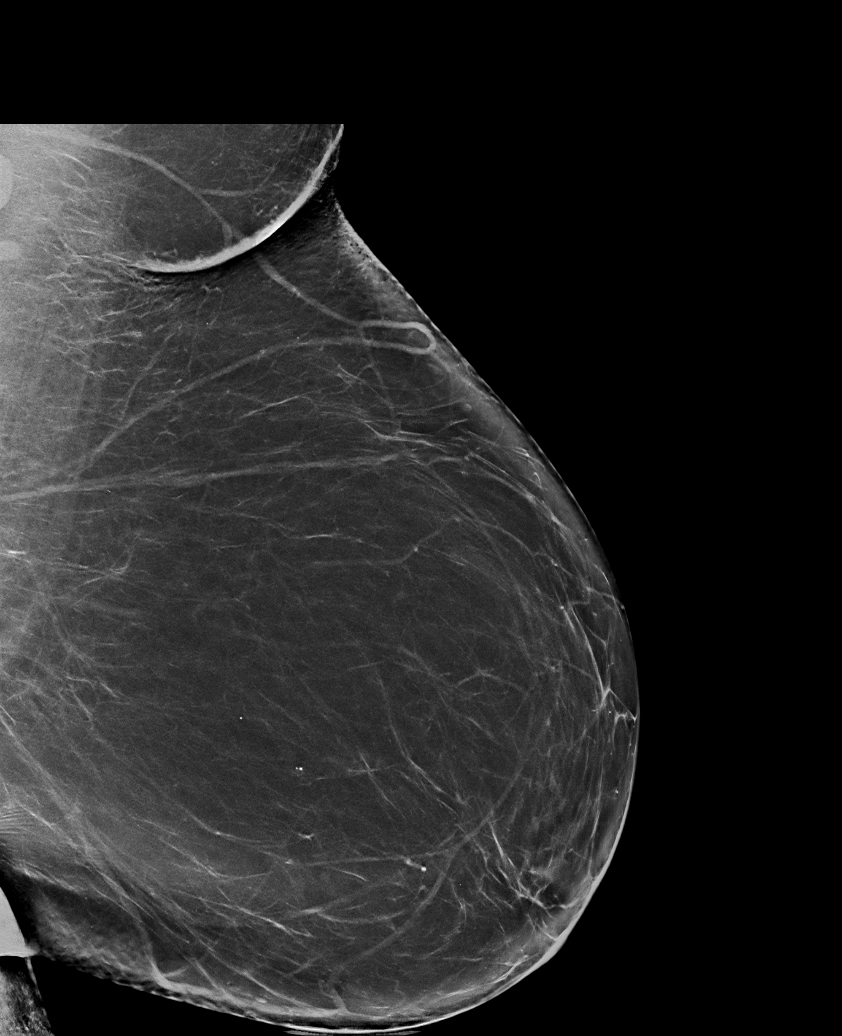

[R CC synth-2D]
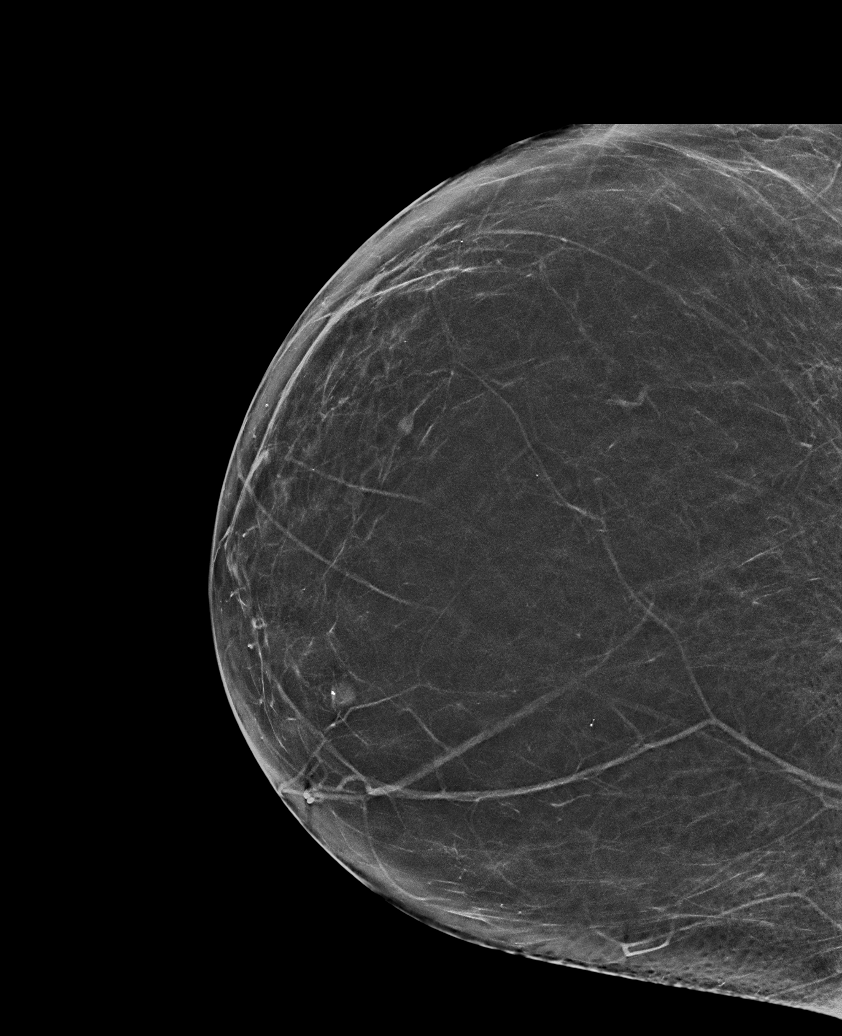

[L MLO tomo · tomo slice 45/90.0]
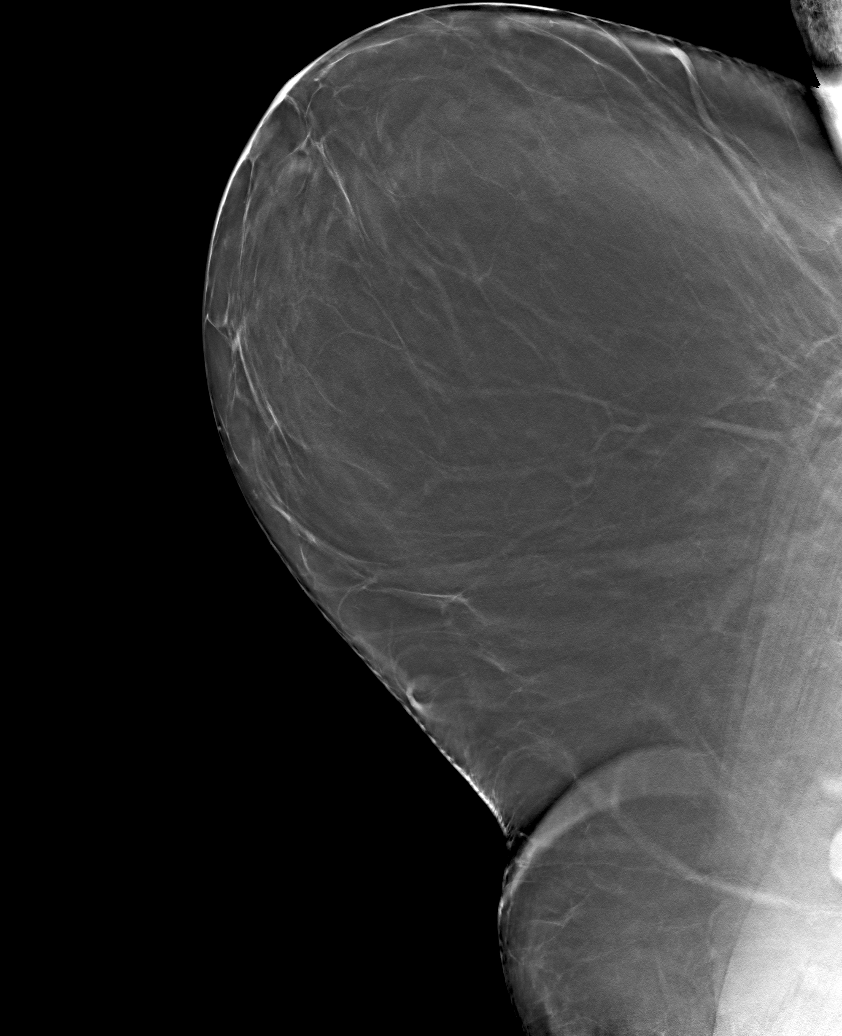

[6 of 30 positions shown; findings below may reference images not displayed]

FINDINGS: There are no findings suspicious for malignancy.
IMPRESSION: No mammographic evidence of malignancy. A result letter of this
screening mammogram will be mailed directly to the patient.

RECOMMENDATION:
Screening mammogram in one year. (Code:0E-[id number])

BI-RADS CATEGORY  1: Negative.

## 2023-10-22 NOTE — H&P (Signed)
 Ms. Teresa Moreno is a 63 y.o. female here for Pre Op Consulting .   History of Present Illness: Patient presents for a preoperative visit to schedule a D&C, hysteroscopy for complex fluid in endometrium and hx of very painful EMBx.   -Hx of incidental finding of thickened endometrium on CT in 12/2022. Ultrasound in 02/2023 with fluid filled endometrium. EMBx at that time was negative but with scant tissue, and we are f/u today for repeat u/s   Pap 12/2022 neg/HPV+   TVUS 02/2023 Uterus 3.8 x 3.3 x 2.2 cm  Endometrium fluid filled, 11.6 mm with walls = 2.5 mm  Bilateral ovaries not visualized   Fibroid uterus, subserosal    12/2022 CT Abdomen & Pelvis  IMPRESSION:  1. No acute localizing process in the abdomen or pelvis.  2. Sigmoid colon diverticulosis.  3. Left renal cysts.  No follow-up imaging recommended.  4. Uterine fibroids.  5. 6 mm low-attenuation area in the lower uterine segment, indeterminate. Recommend further evaluation with pelvic ultrasound.    Pertinent hx:   -S/p norethindrone -Constipation, diverticulitis on CT -Fhx of colon cancer in mother and father  -HTN, on meds    -FHX cervical cancer in sister  Gyn Hx: No LMP recorded. Patient is postmenopausal..     Past Medical History:  has a past medical history of Diverticulitis (03/25), GERD (gastroesophageal reflux disease) (2018), History of abnormal cervical Pap smear (2023), Obesity (12/04/2013), Osteoarthritis, Other and unspecified hyperlipidemia (12/04/2013), Psoriasis, and Unspecified essential hypertension (12/04/2013).  Past Surgical History:  has a past surgical history that includes Cholecystectomy; ankle surgery (Left, 02/2010); leg surgery (2010); varicose vein surgery; Colonoscopy (03/09/2000); Colonoscopy (02/25/2010); Colonoscopy (08/07/2015); egd (02/01/2018); Colonoscopy (10/24/2020); egd (10/24/2020); Colon @ [facility] (10/07/2023); and EGD @ [facility] (10/07/2023). Family History: family history includes Colon  cancer in her father, mother, and another family member; Diabetes in her father and mother; Heart disease in her father and mother; High blood pressure (Hypertension) in her father and mother; Obesity in her father and mother; Pneumonia in her father. Social History:  reports that she has never smoked. She has never used smokeless tobacco. She reports that she does not drink alcohol and does not use drugs. OB/GYN History:  OB History       Gravida 0   Para 0   Term 0   Preterm 0   AB 0   Living 0       SAB 0   IAB 0   Ectopic 0   Molar     Multiple 0   Live Births            Allergies: is allergic to codeine. Medications:  Current Medications   Current Outpatient Medications:    amLODIPine (NORVASC) 5 MG tablet, TAKE ONE TABLET BY MOUTH ONCE DAILY, Disp: 90 tablet, Rfl: 1   cetirizine (ZYRTEC) 10 MG tablet, Take 10 mg by mouth once daily, Disp: , Rfl:    cyclobenzaprine (FLEXERIL) 5 MG tablet, , Disp: , Rfl:    hyoscyamine  (LEVSIN/SL) 0.125 mg SL tablet, Place 1 tablet (0.125 mg total) under the tongue every 6 (six) hours as needed (abd pain), Disp: 30 tablet, Rfl: 3   pantoprazole  (PROTONIX ) 40 MG DR tablet, Take 1 tablet (40 mg total) by mouth 2 (two) times daily Take 30 min before meals, Disp: 60 tablet, Rfl: 3    Review of Systems: No SOB, no palpitations or chest pain, no new lower extremity edema, no nausea or vomiting or bowel or  bladder complaints. See HPI for gyn specific ROS.    Exam:   BP 106/54   Pulse 74   Ht 170.2 cm (5\' 7" )   Wt (!) 107.8 kg (237 lb 9.6 oz)   BMI 37.21 kg/m    General: Patient is well-groomed, well-nourished, appears stated age in no acute distress   HEENT: head is atraumatic and normocephalic, trachea is midline, neck is supple with no palpable nodules   CV: Regular rhythm and normal heart rate, no murmur   Pulm: Clear to auscultation throughout lung fields with no wheezing, crackles, or rhonchi. No  increased work of breathing   Abdomen: soft , no mass, non-tender, no rebound tenderness, no hepatomegaly   Pelvic: deferred   Impression:   The encounter diagnosis was Thickened endometrium.   Plan:   1.  Preoperative visit: D&C hysteroscopy, . Consents signed today.  -Risks of surgery were discussed with the patient including but not limited to: bleeding which may require transfusion; infection which may require antibiotics; injury to uterus or surrounding organs; intrauterine scarring which may impair future fertility; need for additional procedures including laparotomy or laparoscopy; and other postoperative/anesthesia complications. Written informed consent was obtained.   This is a scheduled same-day surgery. She will have a postop visit in 2 weeks to review operative findings and pathology.

## 2023-11-03 ENCOUNTER — Encounter
Admission: RE | Admit: 2023-11-03 | Discharge: 2023-11-03 | Disposition: A | Discharge status: Home / Self Care | Source: Ambulatory Visit | Attending: Obstetrics and Gynecology | Admitting: Obstetrics and Gynecology

## 2023-11-03 ENCOUNTER — Other Ambulatory Visit: Payer: Self-pay

## 2023-11-03 DIAGNOSIS — Z01818 Encounter for other preprocedural examination: Secondary | ICD-10-CM | POA: Diagnosis present

## 2023-11-03 DIAGNOSIS — Z01812 Encounter for preprocedural laboratory examination: Secondary | ICD-10-CM | POA: Diagnosis not present

## 2023-11-03 DIAGNOSIS — R9389 Abnormal findings on diagnostic imaging of other specified body structures: Secondary | ICD-10-CM | POA: Insufficient documentation

## 2023-11-03 LAB — CBC
HCT: 41.2 % (ref 36.0–46.0)
Hemoglobin: 13.1 g/dL (ref 12.0–15.0)
MCH: 27.5 pg (ref 26.0–34.0)
MCHC: 31.8 g/dL (ref 30.0–36.0)
MCV: 86.6 fL (ref 80.0–100.0)
Platelets: 376 10*3/uL (ref 150–400)
RBC: 4.76 MIL/uL (ref 3.87–5.11)
RDW: 14 % (ref 11.5–15.5)
WBC: 8.2 10*3/uL (ref 4.0–10.5)
nRBC: 0 % (ref 0.0–0.2)

## 2023-11-03 NOTE — Patient Instructions (Addendum)
 Your procedure is scheduled on:  THURSDAY MAY 15  Report to the Registration Desk on the 1st floor of the [facility]. To find out your arrival time, please call [phone] between 1PM - 3PM on:  [facility] MAY 14  If your arrival time is 6:00 am, do not arrive before that time as the [facility] entrance doors do not open until 6:00 am.  REMEMBER: Instructions that are not followed completely may result in serious medical risk, up to and including death; or upon the discretion of your surgeon and anesthesiologist your surgery may need to be rescheduled.  Do not eat food after midnight the night before surgery.  No gum chewing or hard candies.  You may however, drink CLEAR liquids up to 2 hours before you are scheduled to arrive for your surgery. Do not drink anything within 2 hours of your scheduled arrival time.  Clear liquids include: - water  - apple juice without pulp - gatorade (not RED colors) - black coffee or tea (Do NOT add milk or creamers to the coffee or tea) Do NOT drink anything that is not on this list.   One week prior to surgery: THURSDAY MAY 8  Stop Anti-inflammatories (NSAIDS) such as Advil, Aleve, Ibuprofen, Motrin, Naproxen, Naprosyn and Aspirin based products such as Excedrin, Goody's Powder, BC Powder. Stop ANY OVER THE COUNTER supplements until after surgery. cholecalciferol (VITAMIN D3)  MAGNESIUM-POTASSIUM PO  Multivitamin Patch  You may however, continue to take Tylenol  if needed for pain up until the day of surgery.  Continue taking all of your other prescription medications up until the day of surgery.  ON THE DAY OF SURGERY ONLY TAKE THESE MEDICATIONS WITH SIPS OF WATER:  pantoprazole  (PROTONIX )   No Alcohol for 24 hours before or after surgery.  No Smoking including e-cigarettes for 24 hours before surgery.  No chewable tobacco products for at least 6 hours before surgery.  No nicotine patches on the day of surgery.  Do not use any  "recreational" drugs for at least a week (preferably 2 weeks) before your surgery.  Please be advised that the combination of cocaine and anesthesia may have negative outcomes, up to and including death. If you test positive for cocaine, your surgery will be cancelled.  On the morning of surgery brush your teeth with toothpaste and water, you may rinse your mouth with mouthwash if you wish. Do not swallow any toothpaste or mouthwash.  Do not wear jewelry, make-up, hairpins, clips or nail polish.  For welded (permanent) jewelry: bracelets, anklets, waist bands, etc.  Please have this removed prior to surgery.  If it is not removed, there is a chance that hospital personnel will need to cut it off on the day of surgery.  Do not wear lotions, powders, or perfumes.   Do not shave body hair from the neck down 48 hours before surgery.  Do not bring valuables to the hospital. [facility] is not responsible for any missing/lost belongings or valuables.   Notify your doctor if there is any change in your medical condition (cold, fever, infection).  Wear comfortable clothing (specific to your surgery type) to the hospital.  After surgery, you can help prevent lung complications by doing breathing exercises.  Take deep breaths and cough every 1-2 hours. Your doctor may order a device called an Incentive Spirometer to help you take deep breaths.  If you are being discharged the day of surgery, you will not be allowed to drive home. You  will need a responsible individual to drive you home and stay with you for 24 hours after surgery.   If you are taking public transportation, you will need to have a responsible individual with you.  Please call the Pre-admissions Testing Dept. at [phone] if you have any questions about these instructions.  Surgery Visitation Policy:  Patients having surgery or a procedure may have two visitors.  Children under the age of 48 must have an adult with them  who is not the patient.    How to Use an Incentive Spirometer  An incentive spirometer is a tool that measures how well you are filling your lungs with each breath. Learning to take long, deep breaths using this tool can help you keep your lungs clear and active. This may help to reverse or lessen your chance of developing breathing (pulmonary) problems, especially infection. You may be asked to use a spirometer: After a surgery. If you have a lung problem or a history of smoking. After a long period of time when you have been unable to move or be active. If the spirometer includes an indicator to show the highest number that you have reached, your health care provider or respiratory therapist will help you set a goal. Keep a log of your progress as told by your health care provider. What are the risks? Breathing too quickly may cause dizziness or cause you to pass out. Take your time so you do not get dizzy or light-headed. If you are in pain, you may need to take pain medicine before doing incentive spirometry. It is harder to take a deep breath if you are having pain. How to use your incentive spirometer  Sit up on the edge of your bed or on a chair. Hold the incentive spirometer so that it is in an upright position. Before you use the spirometer, breathe out normally. Place the mouthpiece in your mouth. Make sure your lips are closed tightly around it. Breathe in slowly and as deeply as you can through your mouth, causing the piston or the ball to rise toward the top of the chamber. Hold your breath for 3-5 seconds, or for as long as possible. If the spirometer includes a coach indicator, use this to guide you in breathing. Slow down your breathing if the indicator goes above the marked areas. Remove the mouthpiece from your mouth and breathe out normally. The piston or ball will return to the bottom of the chamber. Rest for a few seconds, then repeat the steps 10 or more times. Take  your time and take a few normal breaths between deep breaths so that you do not get dizzy or light-headed. Do this every 1-2 hours when you are awake. If the spirometer includes a goal marker to show the highest number you have reached (best effort), use this as a goal to work toward during each repetition. After each set of 10 deep breaths, cough a few times. This will help to make sure that your lungs are clear. If you have an incision on your chest or abdomen from surgery, place a pillow or a rolled-up towel firmly against the incision when you cough. This can help to reduce pain while taking deep breaths and coughing. General tips When you are able to get out of bed: Walk around often. Continue to take deep breaths and cough in order to clear your lungs. Keep using the incentive spirometer until your health care provider says it is okay to stop  using it. If you have been in the hospital, you may be told to keep using the spirometer at home. Contact a health care provider if: You are having difficulty using the spirometer. You have trouble using the spirometer as often as instructed. Your pain medicine is not giving enough relief for you to use the spirometer as told. You have a fever. Get help right away if: You develop shortness of breath. You develop a cough with bloody mucus from the lungs. You have fluid or blood coming from an incision site after you cough. Summary An incentive spirometer is a tool that can help you learn to take long, deep breaths to keep your lungs clear and active. You may be asked to use a spirometer after a surgery, if you have a lung problem or a history of smoking, or if you have been inactive for a long period of time. Use your incentive spirometer as instructed every 1-2 hours while you are awake. If you have an incision on your chest or abdomen, place a pillow or a rolled-up towel firmly against your incision when you cough. This will help to reduce  pain. Get help right away if you have shortness of breath, you cough up bloody mucus, or blood comes from your incision when you cough. This information is not intended to replace advice given to you by your health care provider. Make sure you discuss any questions you have with your health care provider. Document Revised: 09/04/2019 Document Reviewed: 09/04/2019 Elsevier Patient Education  2023 [employer].

## 2023-11-04 ENCOUNTER — Other Ambulatory Visit

## 2023-11-11 ENCOUNTER — Other Ambulatory Visit: Payer: Self-pay

## 2023-11-11 ENCOUNTER — Ambulatory Visit: Payer: Self-pay | Admitting: Urgent Care

## 2023-11-11 ENCOUNTER — Ambulatory Visit
Admission: RE | Admit: 2023-11-11 | Discharge: 2023-11-11 | Disposition: A | Discharge status: Home / Self Care | Source: Ambulatory Visit | Attending: Obstetrics and Gynecology | Admitting: Obstetrics and Gynecology

## 2023-11-11 ENCOUNTER — Ambulatory Visit: Admitting: Anesthesiology

## 2023-11-11 ENCOUNTER — Encounter
Admission: RE | Disposition: A | Discharge status: Home / Self Care | Payer: Self-pay | Source: Ambulatory Visit | Attending: Obstetrics and Gynecology

## 2023-11-11 ENCOUNTER — Encounter: Payer: Self-pay | Admitting: Obstetrics and Gynecology

## 2023-11-11 DIAGNOSIS — N950 Postmenopausal bleeding: Secondary | ICD-10-CM | POA: Diagnosis present

## 2023-11-11 DIAGNOSIS — Z8249 Family history of ischemic heart disease and other diseases of the circulatory system: Secondary | ICD-10-CM | POA: Insufficient documentation

## 2023-11-11 DIAGNOSIS — N840 Polyp of corpus uteri: Secondary | ICD-10-CM | POA: Insufficient documentation

## 2023-11-11 DIAGNOSIS — R9389 Abnormal findings on diagnostic imaging of other specified body structures: Secondary | ICD-10-CM | POA: Diagnosis present

## 2023-11-11 DIAGNOSIS — I10 Essential (primary) hypertension: Secondary | ICD-10-CM | POA: Insufficient documentation

## 2023-11-11 DIAGNOSIS — Z79899 Other long term (current) drug therapy: Secondary | ICD-10-CM | POA: Insufficient documentation

## 2023-11-11 HISTORY — PX: HYSTEROSCOPY WITH D & C: SHX1775

## 2023-11-11 LAB — TYPE AND SCREEN
ABO/RH(D): A NEG
Antibody Screen: NEGATIVE

## 2023-11-11 SURGERY — DILATATION AND CURETTAGE /HYSTEROSCOPY
Anesthesia: General | Site: Cervix

## 2023-11-11 MED ORDER — ONDANSETRON HCL 4 MG/2ML IJ SOLN
INTRAMUSCULAR | Status: AC
  Filled 2023-11-11: qty 2

## 2023-11-11 MED ORDER — PROPOFOL 10 MG/ML IV BOLUS
INTRAVENOUS | Status: DC | PRN
  Administered 2023-11-11: 30 mg via INTRAVENOUS
  Administered 2023-11-11: 70 mg via INTRAVENOUS

## 2023-11-11 MED ORDER — GLYCOPYRROLATE 0.2 MG/ML IJ SOLN
INTRAMUSCULAR | Status: DC | PRN
  Administered 2023-11-11: .2 mg via INTRAVENOUS

## 2023-11-11 MED ORDER — LIDOCAINE HCL (CARDIAC) PF 100 MG/5ML IV SOSY
PREFILLED_SYRINGE | INTRAVENOUS | Status: DC | PRN
  Administered 2023-11-11: 100 mg via INTRAVENOUS

## 2023-11-11 MED ORDER — SODIUM CHLORIDE 0.9 % IV SOLN: INTRAVENOUS | Status: DC | PRN

## 2023-11-11 MED ORDER — POVIDONE-IODINE 10 % EX SWAB: 2.0000 | Freq: Once | CUTANEOUS | Status: DC

## 2023-11-11 MED ORDER — FENTANYL CITRATE (PF) 100 MCG/2ML IJ SOLN
INTRAMUSCULAR | Status: DC | PRN
  Administered 2023-11-11 (×2): 50 ug via INTRAVENOUS

## 2023-11-11 MED ORDER — DEXAMETHASONE SODIUM PHOSPHATE 10 MG/ML IJ SOLN
INTRAMUSCULAR | Status: AC
  Filled 2023-11-11: qty 1

## 2023-11-11 MED ORDER — FENTANYL CITRATE (PF) 100 MCG/2ML IJ SOLN
25.0000 ug | INTRAMUSCULAR | Status: DC | PRN
  Administered 2023-11-11 (×2): 25 ug via INTRAVENOUS
  Administered 2023-11-11: 50 ug via INTRAVENOUS

## 2023-11-11 MED ORDER — ACETAMINOPHEN 500 MG PO TABS
1000.0000 mg | ORAL_TABLET | ORAL | Status: AC
  Administered 2023-11-11: 1000 mg via ORAL

## 2023-11-11 MED ORDER — DEXMEDETOMIDINE HCL IN NACL 80 MCG/20ML IV SOLN
INTRAVENOUS | Status: AC
  Filled 2023-11-11: qty 20

## 2023-11-11 MED ORDER — FENTANYL CITRATE (PF) 100 MCG/2ML IJ SOLN
INTRAMUSCULAR | Status: AC
Start: 2023-11-11 — End: ?
  Filled 2023-11-11: qty 2

## 2023-11-11 MED ORDER — ONDANSETRON HCL 4 MG/2ML IJ SOLN: 4.0000 mg | Freq: Once | INTRAMUSCULAR | Status: DC | PRN

## 2023-11-11 MED ORDER — OXYCODONE HCL 5 MG PO TABS
5.0000 mg | ORAL_TABLET | Freq: Once | ORAL | Status: AC | PRN
  Administered 2023-11-11: 5 mg via ORAL

## 2023-11-11 MED ORDER — PROPOFOL 1000 MG/100ML IV EMUL
INTRAVENOUS | Status: AC
  Filled 2023-11-11: qty 100

## 2023-11-11 MED ORDER — OXYCODONE HCL 5 MG/5ML PO SOLN: 5.0000 mg | Freq: Once | ORAL | Status: AC | PRN

## 2023-11-11 MED ORDER — FENTANYL CITRATE (PF) 100 MCG/2ML IJ SOLN
INTRAMUSCULAR | Status: AC
  Filled 2023-11-11: qty 2

## 2023-11-11 MED ORDER — CHLORHEXIDINE GLUCONATE 0.12 % MT SOLN
15.0000 mL | Freq: Once | OROMUCOSAL | Status: AC
  Administered 2023-11-11: 15 mL via OROMUCOSAL

## 2023-11-11 MED ORDER — DEXMEDETOMIDINE HCL IN NACL 80 MCG/20ML IV SOLN
INTRAVENOUS | Status: DC | PRN
Start: 2023-11-11 — End: 2023-11-11
  Administered 2023-11-11: 12 ug via INTRAVENOUS
  Administered 2023-11-11: 8 ug via INTRAVENOUS

## 2023-11-11 MED ORDER — CHLORHEXIDINE GLUCONATE 0.12 % MT SOLN
OROMUCOSAL | Status: AC
  Filled 2023-11-11: qty 15

## 2023-11-11 MED ORDER — DEXAMETHASONE SODIUM PHOSPHATE 10 MG/ML IJ SOLN
INTRAMUSCULAR | Status: DC | PRN
  Administered 2023-11-11: 5 mg via INTRAVENOUS

## 2023-11-11 MED ORDER — GLYCOPYRROLATE 0.2 MG/ML IJ SOLN
INTRAMUSCULAR | Status: AC
  Filled 2023-11-11: qty 1

## 2023-11-11 MED ORDER — SENNOSIDES-DOCUSATE SODIUM 8.6-50 MG PO TABS: 1.0000 | ORAL_TABLET | Freq: Every day | ORAL | 0 refills | Status: AC

## 2023-11-11 MED ORDER — MIDAZOLAM HCL 2 MG/2ML IJ SOLN
INTRAMUSCULAR | Status: AC
  Filled 2023-11-11: qty 2

## 2023-11-11 MED ORDER — MIDAZOLAM HCL 2 MG/2ML IJ SOLN
INTRAMUSCULAR | Status: DC | PRN
  Administered 2023-11-11: 2 mg via INTRAVENOUS

## 2023-11-11 MED ORDER — ACETAMINOPHEN 500 MG PO TABS
ORAL_TABLET | ORAL | Status: AC
  Filled 2023-11-11: qty 2

## 2023-11-11 MED ORDER — ACETAMINOPHEN 10 MG/ML IV SOLN: 1000.0000 mg | Freq: Once | INTRAVENOUS | Status: DC | PRN

## 2023-11-11 MED ORDER — 0.9 % SODIUM CHLORIDE (POUR BTL) OPTIME
TOPICAL | Status: DC | PRN
  Administered 2023-11-11: 500 mL

## 2023-11-11 MED ORDER — LACTATED RINGERS IV SOLN: INTRAVENOUS | Status: DC

## 2023-11-11 MED ORDER — ORAL CARE MOUTH RINSE: 15.0000 mL | Freq: Once | OROMUCOSAL | Status: AC

## 2023-11-11 MED ORDER — ONDANSETRON HCL 4 MG/2ML IJ SOLN
INTRAMUSCULAR | Status: DC | PRN
  Administered 2023-11-11: 4 mg via INTRAVENOUS

## 2023-11-11 MED ORDER — OXYCODONE HCL 5 MG PO TABS
ORAL_TABLET | ORAL | Status: AC
  Filled 2023-11-11: qty 1

## 2023-11-11 MED ORDER — PROPOFOL 500 MG/50ML IV EMUL
INTRAVENOUS | Status: DC | PRN
  Administered 2023-11-11: 150 ug/kg/min via INTRAVENOUS

## 2023-11-11 SURGICAL SUPPLY — 21 items
BAG PRESSURE INF REUSE 1000 (BAG) ×1 IMPLANT
BASIN KIT SINGLE STR (MISCELLANEOUS) ×1 IMPLANT
DEVICE MYOSURE LITE (MISCELLANEOUS) IMPLANT
DEVICE MYOSURE REACH (MISCELLANEOUS) IMPLANT
DRSG TELFA 3X8 NADH STRL (GAUZE/BANDAGES/DRESSINGS) IMPLANT
ELECTRODE REM PT RTRN 9FT ADLT (ELECTROSURGICAL) ×1 IMPLANT
GLOVE BIO SURGEON STRL SZ7 (GLOVE) ×1 IMPLANT
GLOVE INDICATOR 7.5 STRL GRN (GLOVE) ×1 IMPLANT
GOWN STRL REUS W/ TWL LRG LVL3 (GOWN DISPOSABLE) ×2 IMPLANT
KIT PROCEDURE FLUENT (KITS) ×1 IMPLANT
KIT TURNOVER CYSTO (KITS) ×1 IMPLANT
MANIFOLD NEPTUNE II (INSTRUMENTS) ×1 IMPLANT
PACK DNC HYST (MISCELLANEOUS) ×1 IMPLANT
PAD PREP OB/GYN DISP 24X41 (PERSONAL CARE ITEMS) ×1 IMPLANT
SCRUB CHG 4% DYNA-HEX 4OZ (MISCELLANEOUS) ×1 IMPLANT
SEAL ROD LENS SCOPE MYOSURE (ABLATOR) IMPLANT
SET CYSTO W/LG BORE CLAMP LF (SET/KITS/TRAYS/PACK) IMPLANT
SOL .9 NS 3000ML IRR UROMATIC (IV SOLUTION) ×1 IMPLANT
TRAP FLUID SMOKE EVACUATOR (MISCELLANEOUS) ×1 IMPLANT
TUBING CONNECTING 10 (TUBING) ×1 IMPLANT
WATER STERILE IRR 500ML POUR (IV SOLUTION) ×1 IMPLANT

## 2023-11-11 NOTE — Discharge Instructions (Signed)
 Discharge instructions after a hysteroscopy with dilation and curettage  Signs and Symptoms to Report  Call our office at [phone] if you have any of the following:    Fever over 100.4 degrees or higher  Severe stomach pain not relieved with pain medications  Bright red bleeding that's heavier than a period that does not slow with rest after the first 24 hours  To go the bathroom a lot (frequency), you can't hold your urine (urgency), or it hurts when you empty your bladder (urinate)  Chest pain  Shortness of breath  Pain in the calves of your legs  Severe nausea and vomiting not relieved with anti-nausea medications  Any concerns  What You Can Expect after Surgery  You may see some pink tinged, bloody fluid. This is normal. You may also have cramping for several days.   Activities after Your Discharge Follow these guidelines to help speed your recovery at home:  Don't drive if you are in pain or taking narcotic pain medicine. You may drive when you can safely slam on the brakes, turn the wheel forcefully, and rotate your torso comfortably. This is typically 4-7 days. Practice in a parking lot or side street prior to attempting to drive regularly.   Ask others to help with household chores for 4 weeks.  Don't do strenuous activities, exercises, or sports like vacuuming, tennis, squash, etc. until your doctor says it is safe to do so.  Walk as you feel able. Rest often since it may take a week or two for your energy level to return to normal.   You may climb stairs  Avoid constipation:   -Eat fruits, vegetables, and whole grains. Eat small meals as your appetite will take time to return to normal.   -Drink 6 to 8 glasses of water each day unless your doctor has told you to limit your fluids.   -Use a laxative or stool softener as needed if constipation becomes a problem. You may take Miralax, metamucil, Citrucil, Colace, Senekot, FiberCon, etc. If this does not relieve the  constipation, try two tablespoons of Milk Of Magnesia every 8 hours until your bowels move.   You may shower.   Do not get in a hot tub, swimming pool, etc. until your doctor agrees.  Do not douche, use tampons, or have sex until your doctor says it is okay, usually about 2 weeks.  Take your pain medicine when you need it. The medicine may not work as well if the pain is bad.  Take the medicines you were taking before surgery. Other medications you might need are pain medications (ibuprofen), medications for constipation (Colace) and nausea medications (Zofran).

## 2023-11-11 NOTE — Anesthesia Preprocedure Evaluation (Signed)
 Anesthesia Evaluation  Patient identified by MRN, date of birth, ID band Patient awake    Reviewed: Allergy & Precautions, NPO status , Patient's Chart, lab work & pertinent test results  History of Anesthesia Complications (+) PONV and history of anesthetic complications  Airway Mallampati: II  TM Distance: >3 FB Neck ROM: Full    Dental no notable dental hx. (+) Teeth Intact   Pulmonary neg pulmonary ROS, neg sleep apnea, neg COPD, Patient abstained from smoking.Not current smoker   Pulmonary exam normal breath sounds clear to auscultation       Cardiovascular Exercise Tolerance: Good METShypertension, Pt. on medications (-) CAD and (-) Past MI (-) dysrhythmias  Rhythm:Regular Rate:Normal - Systolic murmurs    Neuro/Psych negative neurological ROS  negative psych ROS   GI/Hepatic ,GERD  Medicated and Controlled,,(+)     (-) substance abuse    Endo/Other  neg diabetes    Renal/GU negative Renal ROS     Musculoskeletal   Abdominal  (+) + obese  Peds  Hematology   Anesthesia Other Findings Past Medical History: 02/07/2018: Achilles tendinitis of right lower extremity No date: basal cell No date: GERD (gastroesophageal reflux disease) No date: Hypertension 05/01/2020: Lumbar radiculopathy, right 12/04/2013: Pure hypercholesterolemia 07/09/2020: Right ankle pain  Reproductive/Obstetrics                             Anesthesia Physical Anesthesia Plan  ASA: 2  Anesthesia Plan: General   Post-op Pain Management: Minimal or no pain anticipated   Induction: Intravenous  PONV Risk Score and Plan: 4 or greater and Propofol infusion, TIVA, Ondansetron , Midazolam and Dexamethasone  Airway Management Planned: Nasal Cannula  Additional Equipment: None  Intra-op Plan:   Post-operative Plan:   Informed Consent: I have reviewed the patients History and Physical, chart, labs and  discussed the procedure including the risks, benefits and alternatives for the proposed anesthesia with the patient or authorized representative who has indicated his/her understanding and acceptance.     Dental advisory given  Plan Discussed with: CRNA and Surgeon  Anesthesia Plan Comments: (Discussed risks of anesthesia with patient, including possibility of difficulty with spontaneous ventilation under anesthesia necessitating airway intervention, PONV, and rare risks such as cardiac or respiratory or neurological events, and allergic reactions. Discussed the role of CRNA in patient's perioperative care. Patient understands.)       Anesthesia Quick Evaluation

## 2023-11-11 NOTE — Anesthesia Procedure Notes (Signed)
 Date/Time: 11/11/2023 7:36 AM  Performed by: Racheal Buddle, CRNAPre-anesthesia Checklist: Patient identified, Emergency Drugs available, Suction available and Patient being monitored Patient Re-evaluated:Patient Re-evaluated prior to induction Oxygen Delivery Method: Simple face mask Induction Type: IV induction Dental Injury: Teeth and Oropharynx as per pre-operative assessment

## 2023-11-11 NOTE — Anesthesia Postprocedure Evaluation (Signed)
 Anesthesia Post Note  Patient: Teresa Moreno  Procedure(s) Performed: DILATATION AND CURETTAGE /HYSTEROSCOPY (Cervix)  Patient location during evaluation: PACU Anesthesia Type: General Level of consciousness: awake and alert Pain management: pain level controlled Vital Signs Assessment: post-procedure vital signs reviewed and stable Respiratory status: spontaneous breathing, nonlabored ventilation, respiratory function stable and patient connected to nasal cannula oxygen Cardiovascular status: blood pressure returned to baseline and stable Postop Assessment: no apparent nausea or vomiting Anesthetic complications: no   No notable events documented.   Last Vitals:  Vitals:   11/11/23 0900 11/11/23 0919  BP: 112/76 114/70  Pulse: (!) 59 61  Resp: 10 15  Temp:    SpO2: 97% 100%    Last Pain:  Vitals:   11/11/23 0919  TempSrc:   PainSc: 1                  Lattie Poli

## 2023-11-11 NOTE — Op Note (Signed)
 Operative Report Hysteroscopy with Dilation and Curettage   Indications: Postmenopausal bleeding   Pre-operative Diagnosis: Thickened endometrial stripe   Post-operative Diagnosis: same.  Procedure: 1. Exam under anesthesia 2. Fractional D&C 3. Hysteroscopy 4. Myosure polypectomy  Surgeon: Prescilla Brod, MD  Assistant(s):  None  Anesthesia: TIVA  Anesthesiologist: Lattie Poli, MD Anesthesiologist: Lattie Poli, MD CRNA: Racheal Buddle, CRNA  Estimated Blood Loss:  less than 50 mL  Total IV Fluids: see anesthesia's report  Urine Output: 30ml  Total Fluid Deficit:  20 mL          Specimens: Endocervical curettings, endometrial curettings, Myosure polyp         Complications:  None; patient tolerated the procedure well.         Disposition: PACU - hemodynamically stable.         Condition: stable  Findings: Uterus measuring 7 cm by sound; normal cervix, vagina, perineum. Proliferative tissue predominantly in the anterior fundal portion of the uterus  Indication for procedure/Consents: 63 y.o. F here for scheduled surgery for the aforementioned diagnoses.     Risks of surgery were discussed with the patient including but not limited to: bleeding which may require transfusion; infection which may require antibiotics; injury to uterus or surrounding organs; intrauterine scarring which may impair future fertility; need for additional procedures including laparotomy or laparoscopy; and other postoperative/anesthesia complications. Written informed consent was obtained.    Procedure Details:   D&C/ Myosure  The patient was taken to the operating room where anesthesia was administered and was found to be adequate.  After a formal and adequate timeout was performed, she was placed in the dorsal lithotomy position and examined with the above findings. She was then prepped and draped in the sterile manner.   Her bladder was catheterized for an estimated amount of clear, yellow  urine. A weighed speculum was then placed in the patient's vagina and a single tooth tenaculum was applied to the anterior lip of the cervix.  An endocervical curette was performed. Her cervix was serially dilated to 15 [place] using Hanks dilators. The hysteroscope was introduced under direct observation  Using lactated ringers  as a distention medium to reveal the above findings. The uterine cavity was carefully examined, both ostia were recognized, and diffusely proliferative endometrium with the possible polyp above were noted.   This was resected using the Myosure device.  After further careful visualization of the uterine cavity, the hysteroscope was removed under direct visualization.  A sharp curettage was then performed until there was a gritty texture in all four quadrants. The final pass felt tenuous, and I was concerned for possible thinning of the myometrial wall and therefore did not re-introduce the camera as I did not want to distend the cavity with pressurized fluid - instead, I had completed the sample already and removed the curet, which was the smallest size. The tenaculum was removed from the anterior lip of the cervix and the vaginal speculum was removed after applying pressure for good hemostasis. I held pressure with a sponge stick as well for 2 minutes and confirmed minimal bleeding from the tenac sites and from the internal os.  The patient tolerated the procedure well and was taken to the recovery area awake and in stable condition. She received iv acetaminophen  and Toradol  prior to leaving the OR.  The patient will be discharged to home as per PACU criteria. Routine postoperative instructions given.  She was prescribed Senna and Colace.  She will follow up in the  clinic in two weeks for postoperative evaluation.

## 2023-11-11 NOTE — Transfer of Care (Signed)
 Immediate Anesthesia Transfer of Care Note  Patient: Teresa Moreno  Procedure(s) Performed: Procedure(s): DILATATION AND CURETTAGE /HYSTEROSCOPY (N/A)  Patient Location: PACU  Anesthesia Type:General  Level of Consciousness: sedated  Airway & Oxygen Therapy: Patient Spontanous Breathing and Patient connected to face mask oxygen  Post-op Assessment: Report given to RN and Post -op Vital signs reviewed and stable  Post vital signs: Reviewed and stable  Last Vitals:  Vitals:   11/11/23 0828 11/11/23 0830  BP: 123/79 123/79  Pulse:  77  Resp:  16  Temp:  (!) 36.4 C  SpO2: 96% 98%    Complications: No apparent anesthesia complications

## 2023-11-12 ENCOUNTER — Encounter: Payer: Self-pay | Admitting: Obstetrics and Gynecology

## 2023-11-12 LAB — SURGICAL PATHOLOGY

## 2024-01-12 ENCOUNTER — Telehealth: Payer: Self-pay | Admitting: Internal Medicine

## 2024-01-12 NOTE — Telephone Encounter (Signed)
 Good afternoon Dr. Albertus,   Patient is requesting to have a second opinion specifically from you. Patient was last seen with [facility] in March and was previously diagnosed with IBS. Patient does not agree with diagnosis because she is still having a lot of upper abdominal complications. Patient stated she was referred by a friend of hers that is a current patient of your. Patient' previous records can be viewed in Epic for you to further advise on request.   Thank you.

## 2024-01-13 NOTE — Telephone Encounter (Signed)
Request received to transfer GI care from outside practice to [facility].  We appreciate the interest in our practice, however at this time due to high demand from patients without established GI providers we cannot accommodate this transfer.  Ability to accommodate future transfer requests may change over time and the patient can contact us again in 6-12 months if still interested in being seen at [facility].      °

## 2024-01-25 NOTE — Telephone Encounter (Signed)
 Left patient voicemail to call back to discuss request

## 2024-01-26 NOTE — Telephone Encounter (Signed)
 Patient advised.

## 2024-01-31 ENCOUNTER — Other Ambulatory Visit: Payer: Self-pay | Admitting: Obstetrics and Gynecology

## 2024-01-31 DIAGNOSIS — Z1231 Encounter for screening mammogram for malignant neoplasm of breast: Secondary | ICD-10-CM

## 2024-04-10 IMAGING — CT CT RENAL STONE PROTOCOL
2 of 4 series · 17 of 46 positions shown, 19 images · non-contrast
Comparison: 1828

CLINICAL DATA: Flank pain, kidney stone suspected; right flank pain



[Series 2: stone full · axial · 0.88mm/px · z∈[+474,+919]mm · 14 of 99 slices shown, 16 images]
[im 5/99  soft-tissue]
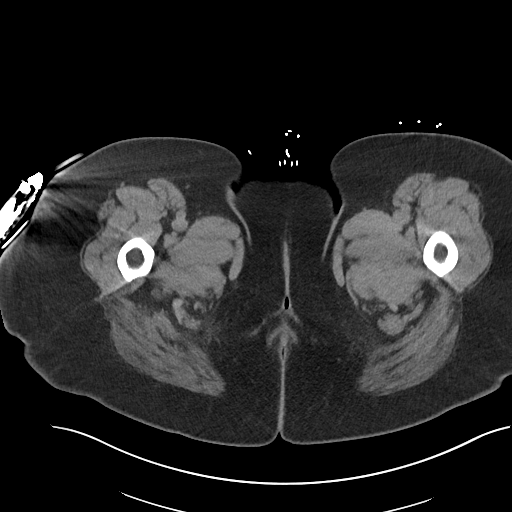
[im 5/99  bone]
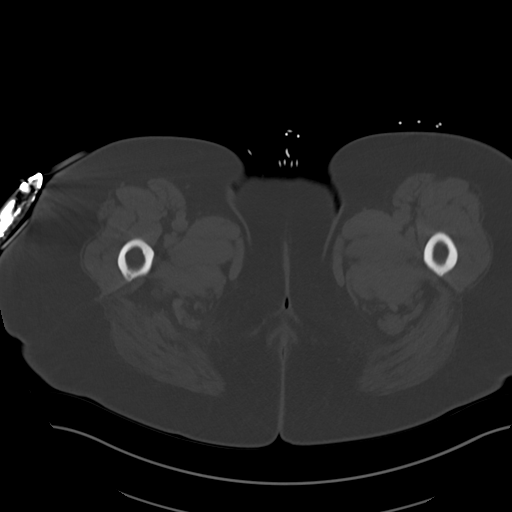
[im 13/99  soft-tissue]
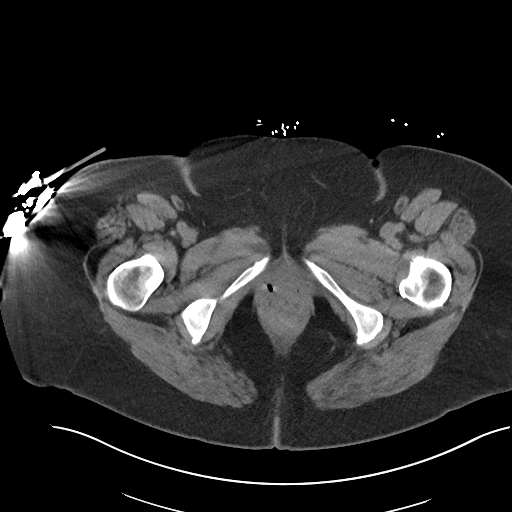
[im 21/99  soft-tissue]
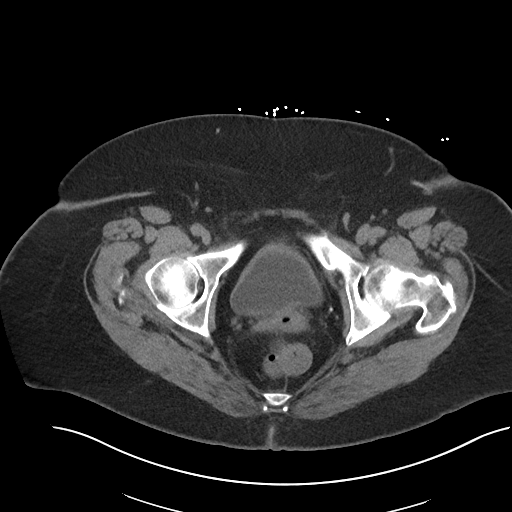
[im 25/99  soft-tissue]
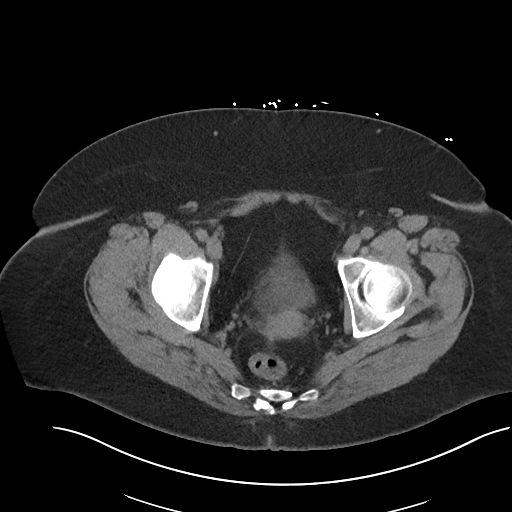
[im 33/99  soft-tissue]
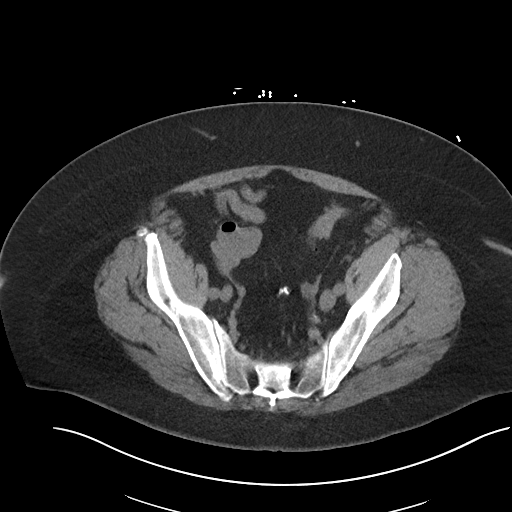
[im 41/99  soft-tissue]
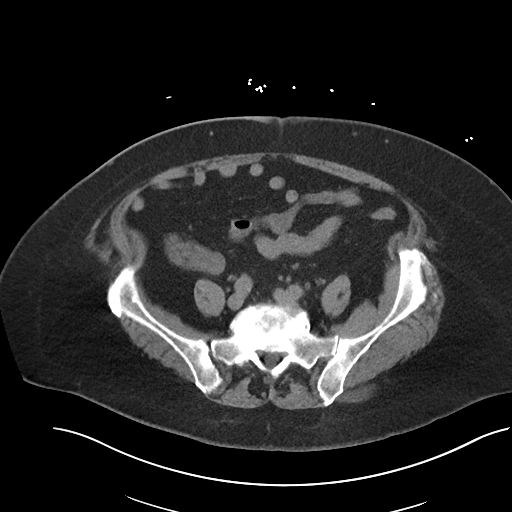
[im 45/99  soft-tissue]
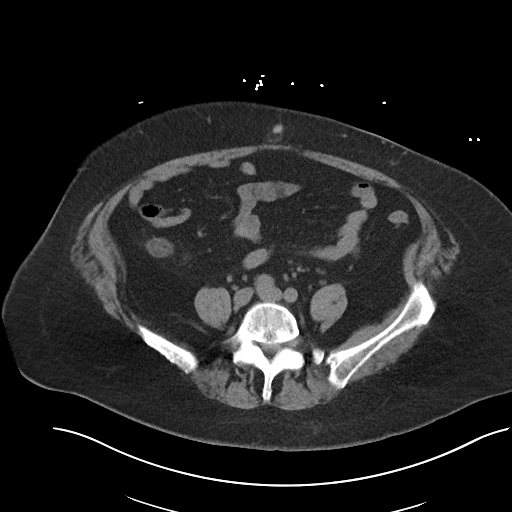
[im 54/99  soft-tissue]
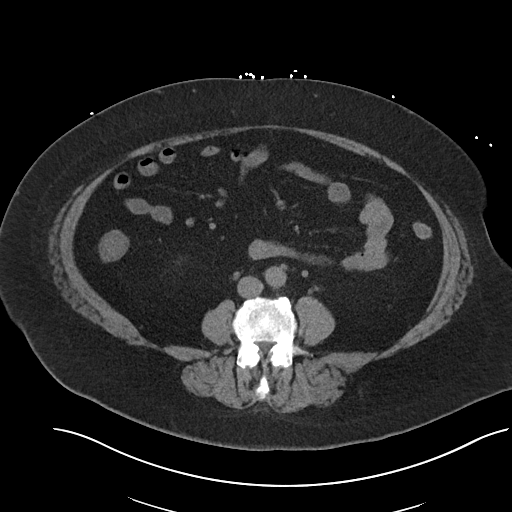
[im 58/99  soft-tissue]
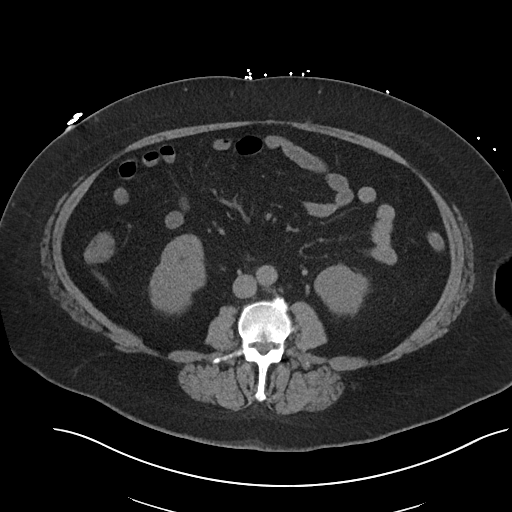
[im 58/99  bone]
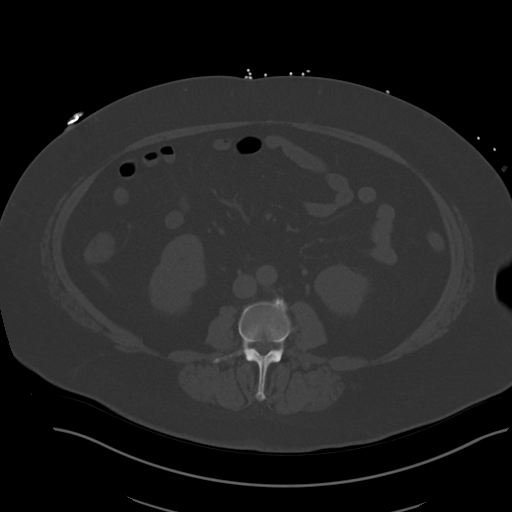
[im 66/99  soft-tissue]
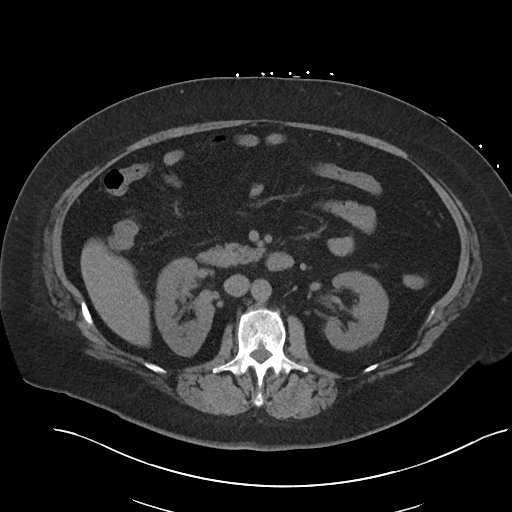
[im 74/99  soft-tissue]
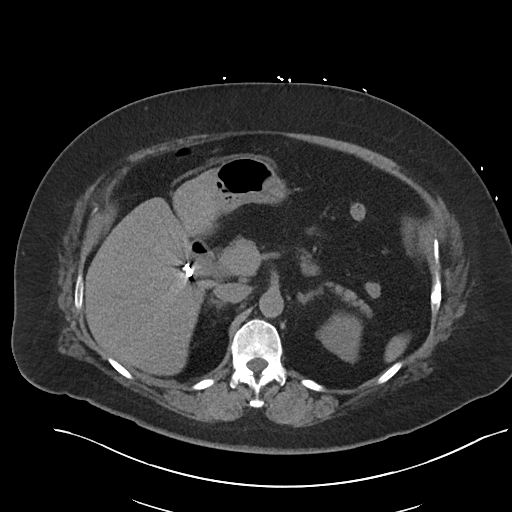
[im 78/99  soft-tissue]
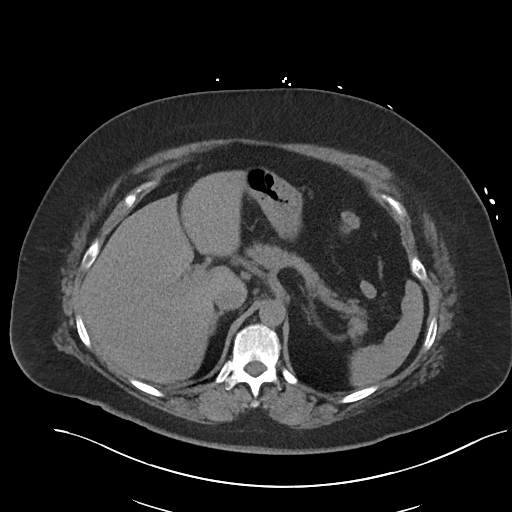
[im 86/99  soft-tissue]
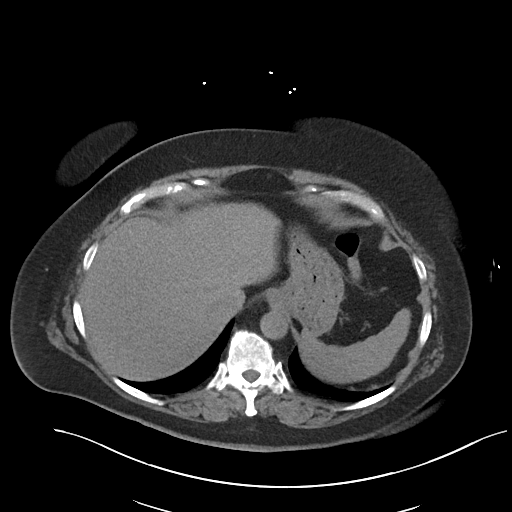
[im 94/99  soft-tissue]
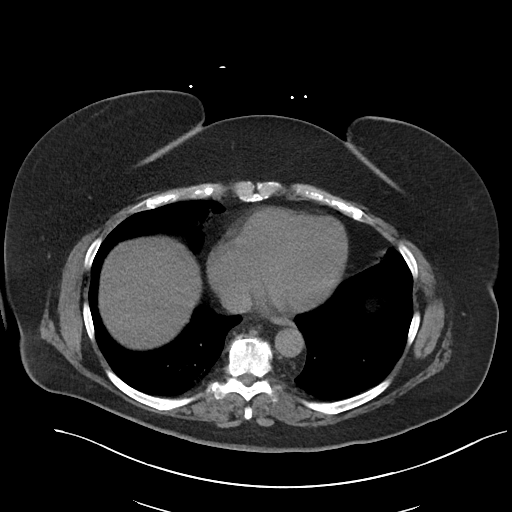

[Series 5: coronal · coronal · 0.98mm/px · 3 of 113 slices shown]
[im 38/113  soft-tissue]
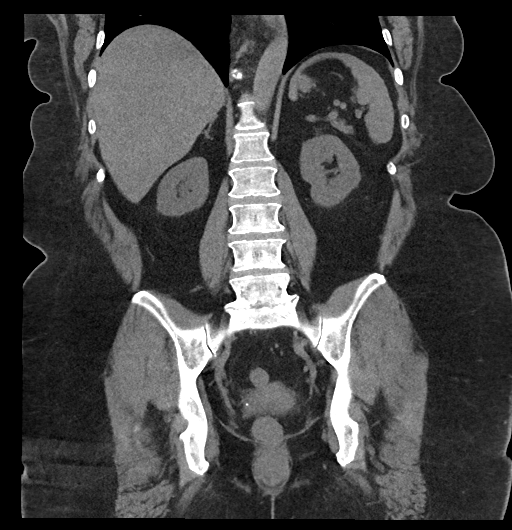
[im 50/113  soft-tissue]
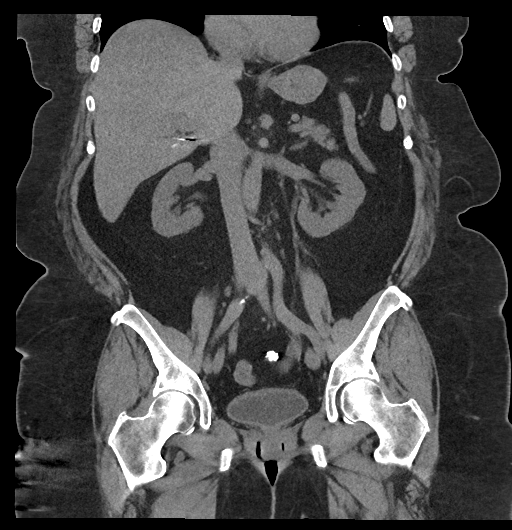
[im 63/113  soft-tissue]
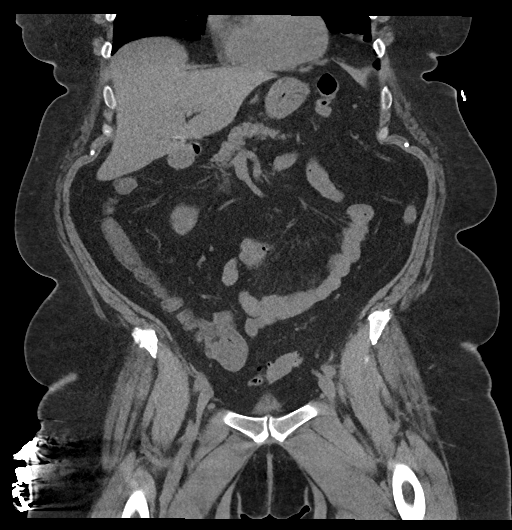

[17 of 46 positions shown; findings below may reference images not displayed]

FINDINGS: Lower chest: Mild atelectasis at the right base.

Hepatobiliary: No focal liver abnormality is seen. Status post
cholecystectomy. No biliary dilatation.

Pancreas: Unremarkable. No pancreatic ductal dilatation or
surrounding inflammatory changes.

Spleen: Normal in size without focal abnormality.

Adrenals/Urinary Tract: Adrenals are unremarkable. No renal calculi
or hydronephrosis. Small cyst of the left kidney. Subcentimeter
focus of fat density at the upper pole of the left kidney likely
reflects an angiomyolipoma.

Stomach/Bowel: Stomach is within normal limits. Bowel is normal in
caliber. Sigmoid diverticulosis.

Vascular/Lymphatic: Minimal atherosclerosis.  No enlarged nodes.

Reproductive: No pelvic mass.

Other: No free fluid.  Abdominal wall is unremarkable.

Musculoskeletal: No acute osseous abnormality. Degenerative changes
of the included spine.
IMPRESSION: No acute abnormality.  No urinary tract calculi.

Probable subcentimeter angiomyolipoma of the left kidney.

Sigmoid diverticulosis.

## 2024-05-22 ENCOUNTER — Ambulatory Visit
Admission: RE | Admit: 2024-05-22 | Discharge: 2024-05-22 | Disposition: A | Discharge status: Home / Self Care | Source: Ambulatory Visit | Attending: Obstetrics and Gynecology | Admitting: Obstetrics and Gynecology

## 2024-05-22 DIAGNOSIS — Z1231 Encounter for screening mammogram for malignant neoplasm of breast: Secondary | ICD-10-CM | POA: Diagnosis present

## 2024-06-07 ENCOUNTER — Encounter (HOSPITAL_BASED_OUTPATIENT_CLINIC_OR_DEPARTMENT_OTHER): Payer: Self-pay | Admitting: Emergency Medicine

## 2024-06-07 ENCOUNTER — Emergency Department (HOSPITAL_BASED_OUTPATIENT_CLINIC_OR_DEPARTMENT_OTHER)
Admission: EM | Admit: 2024-06-07 | Discharge: 2024-06-07 | Disposition: A | Discharge status: Home / Self Care | Attending: Emergency Medicine | Admitting: Emergency Medicine

## 2024-06-07 ENCOUNTER — Other Ambulatory Visit: Payer: Self-pay

## 2024-06-07 ENCOUNTER — Emergency Department (HOSPITAL_BASED_OUTPATIENT_CLINIC_OR_DEPARTMENT_OTHER)

## 2024-06-07 DIAGNOSIS — K5792 Diverticulitis of intestine, part unspecified, without perforation or abscess without bleeding: Secondary | ICD-10-CM

## 2024-06-07 DIAGNOSIS — Z79899 Other long term (current) drug therapy: Secondary | ICD-10-CM | POA: Insufficient documentation

## 2024-06-07 DIAGNOSIS — K5732 Diverticulitis of large intestine without perforation or abscess without bleeding: Secondary | ICD-10-CM | POA: Diagnosis not present

## 2024-06-07 DIAGNOSIS — I10 Essential (primary) hypertension: Secondary | ICD-10-CM | POA: Insufficient documentation

## 2024-06-07 DIAGNOSIS — R1032 Left lower quadrant pain: Secondary | ICD-10-CM | POA: Diagnosis present

## 2024-06-07 LAB — CBC
HCT: 38.9 % (ref 36.0–46.0)
Hemoglobin: 12.6 g/dL (ref 12.0–15.0)
MCH: 27.9 pg (ref 26.0–34.0)
MCHC: 32.4 g/dL (ref 30.0–36.0)
MCV: 86.3 fL (ref 80.0–100.0)
Platelets: 326 K/uL (ref 150–400)
RBC: 4.51 MIL/uL (ref 3.87–5.11)
RDW: 13.7 % (ref 11.5–15.5)
WBC: 11 K/uL — ABNORMAL HIGH (ref 4.0–10.5)
nRBC: 0 % (ref 0.0–0.2)

## 2024-06-07 LAB — URINALYSIS, ROUTINE W REFLEX MICROSCOPIC
Bilirubin Urine: NEGATIVE
Glucose, UA: NEGATIVE mg/dL
Hgb urine dipstick: NEGATIVE
Ketones, ur: NEGATIVE mg/dL
Leukocytes,Ua: NEGATIVE
Nitrite: NEGATIVE
Protein, ur: NEGATIVE mg/dL
Specific Gravity, Urine: <1.005 — ABNORMAL LOW (ref 1.005–1.030)
pH: 6.5 (ref 5.0–8.0)

## 2024-06-07 LAB — BASIC METABOLIC PANEL WITH GFR
Anion gap: 13 (ref 5–15)
BUN: 14 mg/dL (ref 8–23)
CO2: 24 mmol/L (ref 22–32)
Calcium: 9.6 mg/dL (ref 8.9–10.3)
Chloride: 102 mmol/L (ref 98–111)
Creatinine, Ser: 0.8 mg/dL (ref 0.44–1.00)
GFR, Estimated: >60 mL/min (ref >60)
Glucose, Bld: 113 mg/dL — ABNORMAL HIGH (ref 70–99)
Potassium: 3.9 mmol/L (ref 3.5–5.1)
Sodium: 138 mmol/L (ref 135–145)

## 2024-06-07 MED ORDER — METRONIDAZOLE 500 MG PO TABS
500.0000 mg | ORAL_TABLET | Freq: Once | ORAL | Status: AC
  Administered 2024-06-07: 500 mg via ORAL
  Filled 2024-06-07: qty 1

## 2024-06-07 MED ORDER — CIPROFLOXACIN HCL 500 MG PO TABS: 500.0000 mg | ORAL_TABLET | Freq: Two times a day (BID) | ORAL | 0 refills | Status: AC

## 2024-06-07 MED ORDER — METRONIDAZOLE 500 MG PO TABS: 500.0000 mg | ORAL_TABLET | Freq: Three times a day (TID) | ORAL | 0 refills | Status: AC

## 2024-06-07 MED ORDER — HYDROCODONE-ACETAMINOPHEN 5-325 MG PO TABS: 1.0000 | ORAL_TABLET | Freq: Four times a day (QID) | ORAL | 0 refills | Status: AC | PRN

## 2024-06-07 MED ORDER — CIPROFLOXACIN HCL 500 MG PO TABS
500.0000 mg | ORAL_TABLET | Freq: Once | ORAL | Status: AC
  Administered 2024-06-07: 500 mg via ORAL
  Filled 2024-06-07: qty 1

## 2024-06-07 NOTE — ED Triage Notes (Signed)
 LL pelvic and groin pain. Started tonight. -N/-V/+D. No tenderness to palpation. Chronic back pain. Polyuria.

## 2024-06-07 NOTE — Discharge Instructions (Signed)
 Begin taking Cipro  and Flagyl  as prescribed.  Begin taking hydrocodone  as prescribed as needed for pain.  Follow-up with primary doctor if not improving in the next few days, and return to the ER if you develop worsening pain, high fevers, bloody stools, or for other new and concerning symptoms.

## 2024-06-07 NOTE — ED Provider Notes (Signed)
 [place] EMERGENCY DEPARTMENT AT [facility] Provider Note   CSN: [id number] Arrival date & time: 06/07/24  9950     Patient presents with: Groin Pain (left)   Teresa Moreno is a 63 y.o. female.   Patient is a 63 year old female with history of hypertension, hyperlipidemia.  Patient presenting today with complaints of left lower quadrant pain.  Symptoms started suddenly 2 hours prior to arrival.  She denies any bowel or bladder complaints.  No fevers or chills.  She is concerned this is either a kidney stone or diverticulitis.  No aggravating or alleviating factors.       Prior to Admission medications   Medication Sig Start Date End Date Taking? Authorizing Provider  amLODipine (NORVASC) 5 MG tablet Take 5 mg by mouth daily. 02/23/17   [provider]  cholecalciferol (VITAMIN D3) 25 MCG (1000 UNIT) tablet Take 1,000 Units by mouth daily.    [provider]  hyoscyamine  (LEVSIN AMIEL) 0.125 MG SL tablet Place 1 tablet (0.125 mg total) under the tongue every 4 (four) hours as needed. 08/22/23   Haze Lonni PARAS, MD  MAGNESIUM-POTASSIUM PO Take 1-2 tablets by mouth daily.    [provider]  methocarbamol  (ROBAXIN ) 500 MG tablet Take 1 tablet (500 mg total) by mouth 2 (two) times daily. Patient taking differently: Take 500 mg by mouth 2 (two) times daily as needed for muscle spasms. 11/14/21   Hildegard, Amjad, PA-C  mometasone (ELOCON) 0.1 % cream Apply 1 Application topically 2 (two) times daily as needed (irritation). 02/05/14   [provider]  ondansetron  (ZOFRAN ) 4 MG tablet Take 1 tablet (4 mg total) by mouth every 4 (four) hours as needed for nausea or vomiting. 09/20/23   Elnor Savant A, DO  OVER THE COUNTER MEDICATION Place 1 patch onto the skin daily. Multivitamin Patch    [provider]  pantoprazole  (PROTONIX ) 20 MG tablet Take 20 mg by mouth daily as needed for heartburn or indigestion.    [provider]   pantoprazole  (PROTONIX ) 40 MG tablet Take 40 mg by mouth daily. Make take an additional dose if needed 04/12/17   [provider]  senna-docusate (SENOKOT-S) 8.6-50 MG tablet Take 1 tablet by mouth daily. 11/11/23   Verdon Keen, MD  sucralfate  (CARAFATE ) 1 g tablet Take 1 g by mouth 3 (three) times daily as needed (coat stomach).    [provider]    Allergies: Codeine    Review of Systems  All other systems reviewed and are negative.   Updated Vital Signs BP (!) 144/75   Pulse 82   Temp 97.9 F (36.6 C) (Oral)   Resp 16   LMP 01/14/2015   SpO2 100%   Physical Exam Vitals and nursing note reviewed.  Constitutional:      General: She is not in acute distress.    Appearance: She is well-developed. She is not diaphoretic.  HENT:     Head: Normocephalic and atraumatic.  Cardiovascular:     Rate and Rhythm: Normal rate and regular rhythm.     Heart sounds: No murmur heard.    No friction rub. No gallop.  Pulmonary:     Effort: Pulmonary effort is normal. No respiratory distress.     Breath sounds: Normal breath sounds. No wheezing.  Abdominal:     General: Bowel sounds are normal. There is no distension.     Palpations: Abdomen is soft.     Tenderness: There is no abdominal tenderness.  Musculoskeletal:        General: Normal range of motion.     Cervical back: Normal range of motion and neck supple.  Skin:    General: Skin is warm and dry.  Neurological:     General: No focal deficit present.     Mental Status: She is alert and oriented to person, place, and time.     (all labs ordered are listed, but only abnormal results are displayed) Labs Reviewed  URINALYSIS, ROUTINE W REFLEX MICROSCOPIC  BASIC METABOLIC PANEL WITH GFR  CBC    EKG: None  Radiology: No results found.   Procedures   Medications Ordered in the ED - No data to display                                  Medical Decision Making Amount and/or Complexity of Data  Reviewed Labs: ordered. Radiology: ordered.  Risk Prescription drug management.   Patient is a 63 year old female presenting with left lower quadrant pain.  Symptoms started earlier this evening.  Patient arrives here with stable vital signs and is afebrile.  Physical examination reveals mild tenderness to the left lower quadrant, but no peritoneal signs.  Laboratory studies obtained including CBC, basic metabolic panel, and urinalysis.  White count is 11,000, but remainder of studies unremarkable.  CT scan with renal protocol obtained showing acute, uncomplicated early diverticulitis.    Patient to be treated with Cipro  and Flagyl  and pain medication.  To follow-up as needed.     Final diagnoses:  None    ED Discharge Orders     None          Geroldine Berg, MD 06/07/24 [id number]

## 2024-08-08 ENCOUNTER — Ambulatory Visit: Admitting: Family Medicine
